# Patient Record
Sex: Female | Born: 1990 | Race: Black or African American | Hispanic: No | Marital: Single | Smoking: Never smoker
Health system: Southern US, Community
[De-identification: ages and names within clinical notes are randomized; demographics above are authoritative.]

## PROBLEM LIST (undated history)

## (undated) DIAGNOSIS — K219 Gastro-esophageal reflux disease without esophagitis: Secondary | ICD-10-CM

## (undated) HISTORY — PX: NO PAST SURGERIES: SHX2092

## (undated) SURGERY — REPAIR, LACERATION, FACE
Anesthesia: General

---

## 2009-03-16 ENCOUNTER — Emergency Department (HOSPITAL_COMMUNITY): Admission: EM | Admit: 2009-03-16 | Discharge: 2009-03-16 | Payer: Self-pay | Admitting: Emergency Medicine

## 2011-04-21 ENCOUNTER — Emergency Department (HOSPITAL_COMMUNITY): Payer: Self-pay

## 2011-04-21 ENCOUNTER — Encounter (HOSPITAL_COMMUNITY): Payer: Self-pay | Admitting: *Deleted

## 2011-04-21 ENCOUNTER — Emergency Department (HOSPITAL_COMMUNITY)
Admission: EM | Admit: 2011-04-21 | Discharge: 2011-04-21 | Disposition: A | Discharge status: Home / Self Care | Payer: Self-pay | Attending: Emergency Medicine | Admitting: Emergency Medicine

## 2011-04-21 DIAGNOSIS — S0100XA Unspecified open wound of scalp, initial encounter: Secondary | ICD-10-CM | POA: Insufficient documentation

## 2011-04-21 DIAGNOSIS — M795 Residual foreign body in soft tissue: Secondary | ICD-10-CM | POA: Insufficient documentation

## 2011-04-21 DIAGNOSIS — R221 Localized swelling, mass and lump, neck: Secondary | ICD-10-CM | POA: Insufficient documentation

## 2011-04-21 DIAGNOSIS — R220 Localized swelling, mass and lump, head: Secondary | ICD-10-CM | POA: Insufficient documentation

## 2011-04-21 DIAGNOSIS — S0990XA Unspecified injury of head, initial encounter: Secondary | ICD-10-CM | POA: Insufficient documentation

## 2011-04-21 MED ORDER — TETANUS-DIPHTH-ACELL PERTUSSIS 5-2.5-18.5 LF-MCG/0.5 IM SUSP
0.5000 mL | Freq: Once | INTRAMUSCULAR | Status: AC
  Administered 2011-04-21: 0.5 mL via INTRAMUSCULAR
  Filled 2011-04-21: qty 0.5
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
## 2011-04-21 NOTE — ED Notes (Signed)
Reports starting today "has a sensation of liquid pouring down the right side of head and face."

## 2011-04-21 NOTE — ED Notes (Signed)
Pt discharged home. Had no further questions. 

## 2011-04-21 NOTE — ED Notes (Signed)
Pt transported to CT ?
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
## 2011-04-21 NOTE — ED Notes (Signed)
Pt resting quietly at the time. No signs of distress. Family at the bedside. Pt alert and oriented x4. Vital signs stable.

## 2011-07-26 ENCOUNTER — Emergency Department (INDEPENDENT_AMBULATORY_CARE_PROVIDER_SITE_OTHER)
Admission: EM | Admit: 2011-07-26 | Discharge: 2011-07-26 | Disposition: A | Discharge status: Home / Self Care | Payer: Self-pay | Source: Home / Self Care | Attending: Emergency Medicine | Admitting: Emergency Medicine

## 2011-07-26 ENCOUNTER — Encounter (HOSPITAL_COMMUNITY): Payer: Self-pay

## 2011-07-26 DIAGNOSIS — K3189 Other diseases of stomach and duodenum: Secondary | ICD-10-CM

## 2011-07-26 DIAGNOSIS — R1013 Epigastric pain: Secondary | ICD-10-CM

## 2011-07-26 MED ORDER — OMEPRAZOLE 20 MG PO CPDR: 20.0000 mg | DELAYED_RELEASE_CAPSULE | Freq: Two times a day (BID) | ORAL | Status: DC

## 2011-07-26 MED ORDER — SUCRALFATE 1 GM/10ML PO SUSP: 1.0000 g | Freq: Four times a day (QID) | ORAL | Status: DC

## 2011-07-26 NOTE — Discharge Instructions (Signed)
Indigestion  Indigestion is discomfort in the upper abdomen that is caused by underlying problems such as gastroesophageal reflux disease (GERD), ulcers, or gallbladder problems.   CAUSES   Indigestion can be caused by many things. Possible causes include:   Stomach acid in the esophagus.   Stomach infections, usually caused by the bacteria H. pylori.   Being overweight.   Hiatal hernia. This means part of the stomach pushes up through the diaphragm.   Overeating.   Emotional problems, such as stress, anxiety, or depression.   Poor nutrition.   Consuming too much alcohol, tobacco, or caffeine.   Consuming spicy foods, fats, peppermint, chocolate, tomato products, citrus, or fruit juices.   Medicines such as aspirin and other anti-inflammatory drugs, hormones, steroids, and thyroid medicines.   Gastroparesis. This is a condition in which the stomach does not empty properly.   Stomach cancer.   Pregnancy, due to an increase in hormone levels, a relaxation of muscles in the digestive tract, and pressure on the stomach from the growing fetus.  SYMPTOMS    Uncomfortable feeling of fullness after eating.   Pain or burning sensation in the upper abdomen.   Bloating.   Belching and gas.   Nausea and vomiting.   Acidic taste in the mouth.   Burning sensation in the chest (heartburn).  DIAGNOSIS   Your caregiver will review your medical history and perform a physical exam. Other tests, such as blood tests, stool tests, X-rays, and other imaging scans, may be done to check for more serious problems.  TREATMENT   Liquid antacids and other drugs may be given to block stomach acid secretion. Medicines that increase esophageal muscle tone may also be given to help reduce symptoms. If an infection is found, antibiotic medicine may be given.  HOME CARE INSTRUCTIONS   Avoid foods and drinks that make your symptoms worse, such as:   Caffeine or alcoholic drinks.   Chocolate.   Peppermint or mint  flavorings.   Garlic and onions.   Spicy foods.   Citrus fruits, such as oranges, lemons, or limes.   Tomato-based foods such as sauce, chili, salsa, and pizza.   Fried and fatty foods.   Avoid eating for the 3 hours prior to your bedtime.   Eat small, frequent meals instead of large meals.   Stop smoking if you smoke.   Maintain a healthy weight.   Wear loose-fitting clothing. Do not wear anything tight around your waist that causes pressure on your stomach.   Raise the head of your bed 4 to 8 inches with wood blocks to help you sleep. Extra pillows will not help.   Only take over-the-counter or prescription medicines as directed by your caregiver.   Do not take aspirin, ibuprofen, or other nonsteroidal anti-inflammatory drugs (NSAIDs).  SEEK IMMEDIATE MEDICAL CARE IF:    You are not better after 2 days.   You have chest pressure or pain that radiates up into your neck, arms, back, jaw, or upper abdomen.   You have difficulty swallowing.   You keep vomiting.   You have black or bloody stools.   You have a fever.   You have dizziness, fainting, difficulty breathing, or heavy sweating.   You have severe abdominal pain.   You lose weight without trying.  MAKE SURE YOU:   Understand these instructions.   Will watch your condition.   Will get help right away if you are not doing well or get worse.  Document Released: 04/25/2004 

## 2011-07-26 NOTE — ED Notes (Signed)
C/o pain in epigastric area for past couple of months, worse past 2 days; kept me up all night yest; has not taken any meds for this problem; pain 8 last night, 4-5 now; NAD; pain epigastric to upper sternal area , through to mid/upper back
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
## 2011-08-01 ENCOUNTER — Telehealth (HOSPITAL_COMMUNITY): Payer: Self-pay | Admitting: *Deleted

## 2011-08-01 NOTE — ED Notes (Signed)
Pt. came to [facility] for her HIV result. Pt. verified with picture ID and shown her neg. result. Pt.instructed to get it rechecked in 6 mos. if she had an exposure. [name] 08/01/2011

## 2013-03-30 ENCOUNTER — Inpatient Hospital Stay (HOSPITAL_COMMUNITY)
Admission: AD | Admit: 2013-03-30 | Discharge: 2013-03-30 | Disposition: A | Discharge status: Home / Self Care | Payer: Self-pay | Source: Ambulatory Visit | Attending: Emergency Medicine | Admitting: Emergency Medicine

## 2013-03-30 ENCOUNTER — Inpatient Hospital Stay (HOSPITAL_COMMUNITY): Payer: Self-pay

## 2013-03-30 ENCOUNTER — Encounter (HOSPITAL_COMMUNITY): Payer: Self-pay

## 2013-03-30 DIAGNOSIS — R209 Unspecified disturbances of skin sensation: Secondary | ICD-10-CM | POA: Insufficient documentation

## 2013-03-30 DIAGNOSIS — R0602 Shortness of breath: Secondary | ICD-10-CM | POA: Insufficient documentation

## 2013-03-30 DIAGNOSIS — M79609 Pain in unspecified limb: Secondary | ICD-10-CM | POA: Insufficient documentation

## 2013-03-30 DIAGNOSIS — K219 Gastro-esophageal reflux disease without esophagitis: Secondary | ICD-10-CM | POA: Insufficient documentation

## 2013-03-30 DIAGNOSIS — R000 Tachycardia, unspecified: Secondary | ICD-10-CM

## 2013-03-30 DIAGNOSIS — R079 Chest pain, unspecified: Secondary | ICD-10-CM | POA: Insufficient documentation

## 2013-03-30 HISTORY — DX: Gastro-esophageal reflux disease without esophagitis: K21.9

## 2013-03-30 LAB — CBC
HCT: 36.1 % (ref 36.0–46.0)
Hemoglobin: 12.3 g/dL (ref 12.0–15.0)
MCH: 31.1 pg (ref 26.0–34.0)
MCV: 91.4 fL (ref 78.0–100.0)
Platelets: 238 10*3/uL (ref 150–400)
RBC: 3.95 MIL/uL (ref 3.87–5.11)
WBC: 5.1 10*3/uL (ref 4.0–10.5)

## 2013-03-30 LAB — COMPREHENSIVE METABOLIC PANEL
AST: 19 U/L (ref 0–37)
BUN: 11 mg/dL (ref 6–23)
CO2: 25 mEq/L (ref 19–32)
Chloride: 102 mEq/L (ref 96–112)
Creatinine, Ser: 0.64 mg/dL (ref 0.50–1.10)
GFR calc Af Amer: >90 mL/min (ref >90)
GFR calc non Af Amer: >90 mL/min (ref >90)
Glucose, Bld: 78 mg/dL (ref 70–99)
Total Bilirubin: 0.3 mg/dL (ref 0.3–1.2)

## 2013-03-30 LAB — URINALYSIS, ROUTINE W REFLEX MICROSCOPIC
Hgb urine dipstick: NEGATIVE
Nitrite: NEGATIVE
Protein, ur: NEGATIVE mg/dL
Urobilinogen, UA: 1 mg/dL (ref 0.0–1.0)

## 2013-03-30 LAB — T4, FREE: Free T4: 0.92 ng/dL (ref 0.80–1.80)

## 2013-03-30 LAB — T3, FREE: T3, Free: 2.9 pg/mL (ref 2.3–4.2)

## 2013-03-30 LAB — PREGNANCY, URINE: Preg Test, Ur: NEGATIVE

## 2013-03-30 LAB — TSH: TSH: 0.972 u[IU]/mL (ref 0.350–4.500)

## 2013-03-30 MED ORDER — LORAZEPAM 2 MG/ML IJ SOLN
0.5000 mg | Freq: Once | INTRAMUSCULAR | Status: AC
  Administered 2013-03-30: 0.5 mg via INTRAVENOUS
  Filled 2013-03-30: qty 1

## 2013-03-30 MED ORDER — ASPIRIN 81 MG PO CHEW
324.0000 mg | CHEWABLE_TABLET | Freq: Once | ORAL | Status: AC
  Administered 2013-03-30: 324 mg via ORAL

## 2013-03-30 MED ORDER — ASPIRIN 81 MG PO CHEW
CHEWABLE_TABLET | ORAL | Status: AC
  Filled 2013-03-30: qty 4

## 2013-03-30 MED ORDER — IOHEXOL 350 MG/ML SOLN
100.0000 mL | Freq: Once | INTRAVENOUS | Status: AC | PRN
  Administered 2013-03-30: 100 mL via INTRAVENOUS

## 2013-03-30 MED ORDER — SODIUM CHLORIDE 0.9 % IV BOLUS (SEPSIS)
1000.0000 mL | Freq: Once | INTRAVENOUS | Status: AC
  Administered 2013-03-30: 1000 mL via INTRAVENOUS

## 2013-03-30 NOTE — MAU Note (Signed)
Patient is in with c/o chest pain that have gotten worse (started 6 months ago) and left arm numbness. She c/o tensed in her upper abdominal wall, back. She states that she have periods of her body feeling stiff/tensed. She is speaking without distress, she is alert, oriented able to move all extremities. Her lungs are clear, her pupils are equal.

## 2013-03-30 NOTE — MAU Provider Note (Signed)
History     CSN: [id number]  Arrival date and time: 03/30/13 1031   First Provider Initiated Contact with Patient 03/30/13 1047      Chief Complaint  Patient presents with  . Chest Pain  . Nausea  . Numbness   HPI This is a 22 y.o. female who is not pregnant who presents with c/o chest pain, left arm pain and total body numbness. States has had this pain for 6 months, usually at night when supine. TOday the pain has been present all day, even when up and around. Also complains of new onset left arm pain and shortness of breath. States passed out a month ago, but did not strike head.  Has had numbness ever since. Was seen at Urgent Care for this and evaluated for GERD.  States never got better. Does not have a primary doctor, [place]   RN Note: Patient is in with c/o chest pain that have gotten worse (started 6 months ago) and left arm numbness. She c/o tensed in her upper abdominal wall, back. She states that she have periods of her body feeling stiff/tensed. She is speaking without distress, she is alert, oriented able to move all extremities. Her lungs are clear, her pupils are equal.      OB History   Grav Para Term Preterm Abortions TAB SAB Ect Mult Living   0               Past Medical History  Diagnosis Date  . Acid reflux     Past Surgical History  Procedure Laterality Date  . No past surgeries      History reviewed. No pertinent family history.  History  Substance Use Topics  . Smoking status: Never Smoker   . Smokeless tobacco: Not on file  . Alcohol Use: Yes     Comment: occasional on weekends    Allergies: No Known Allergies  Prescriptions prior to admission  Medication Sig Dispense Refill  . omeprazole (PRILOSEC) 20 MG capsule Take 1 capsule (20 mg total) by mouth 2 (two) times daily before a meal.  60 capsule  0  . sucralfate (CARAFATE) 1 GM/10ML suspension Take 10 mLs (1 g total) by mouth 4 (four) times daily.  420 mL  0     Review of Systems  Constitutional: Negative for fever, chills and malaise/fatigue.  HENT: Negative for sore throat.   Eyes: Positive for blurred vision.  Respiratory: Positive for shortness of breath. Negative for cough and wheezing.   Cardiovascular: Positive for chest pain. Negative for leg swelling.  Gastrointestinal: Negative for heartburn, nausea, vomiting, abdominal pain, diarrhea and constipation.  Genitourinary: Negative for dysuria.  Musculoskeletal: Negative for back pain, myalgias and neck pain.  Neurological: Positive for tingling, tremors and weakness. Negative for dizziness, focal weakness, seizures, loss of consciousness and headaches.  Psychiatric/Behavioral: The patient is nervous/anxious.    Physical Exam   Blood pressure 119/76, pulse 94, temperature 98.3 F (36.8 C), temperature source Oral, resp. rate 18, last menstrual period 03/25/2013, SpO2 100.00%.  Physical Exam  Constitutional: She is oriented to person, place, and time. She appears well-developed and well-nourished. No distress.  HENT:  Head: Normocephalic.  Eyes: Pupils are equal, round, and reactive to light.  Neck: Normal range of motion. Neck supple.  Cardiovascular: Normal rate, regular rhythm and normal heart sounds.  Exam reveals no gallop and no friction rub.   No murmur heard. Respiratory: Effort normal and breath sounds normal. No stridor.  No respiratory distress (O2 Saturation 100% on room air). She has no wheezes. She has no rales. She exhibits no tenderness.  GI: Soft. She exhibits no distension. There is no tenderness. There is no rebound and no guarding.  Musculoskeletal: Normal range of motion.  Lymphadenopathy:    She has no cervical adenopathy.  Neurological: She is alert and oriented to person, place, and time.  Skin: Skin is warm and dry.  Psychiatric: She has a normal mood and affect.    MAU Course  Procedures  MDM Chest pain protocol initiated.  ASA given. Labs drawn,  except Troponin.  EKG done >> Normal sinus rhythm   Assessment and Plan  A:  Chest pain       Shortness of breath with normal oxygen saturation       Paresthesia  P:  ED physician from [facility] ED contacted, Dr Gwendolyn Grant accepts patient       No gynecologic condition / complaint       Transfer to [facility] ED for further evaluation  [facility] 03/30/2013, 11:09 AM

## 2013-03-30 NOTE — ED Notes (Signed)
[facility]. Pt has hx of GERD, has not been taking her regular medications because "I don't feel like I need them". Pt also states sharp intermittant CP x6 months that gets worse at night and c/o left sided numbness with the cp. Pt ate pizza last night and states the pain has been worse since then, given 324mg  of aspirin in route. 20 G in left wrist.

## 2013-03-30 NOTE — ED Notes (Signed)
Pt called out feeling anxious, hyperventilating. States she doesn't feel good. HR elevated. Dr. Juleen China made aware. Pt instructed on deep breathing techniques. Reassurance provided.

## 2013-03-31 LAB — DRUGS OF ABUSE SCREEN W/O ALC, ROUTINE URINE
Benzodiazepines.: NEGATIVE
Marijuana Metabolite: NEGATIVE
Opiate Screen, Urine: NEGATIVE
Phencyclidine (PCP): NEGATIVE
Propoxyphene: NEGATIVE

## 2013-04-02 NOTE — MAU Provider Note (Signed)
Attestation of Attending Supervision of Advanced Practitioner (CNM/NP): Evaluation and management procedures were performed by the Advanced Practitioner under my supervision and collaboration.  I have reviewed the Advanced Practitioner's note and chart, and I agree with the management and plan.  Reannah Totten 04/02/2013 12:33 PM   

## 2013-04-07 NOTE — ED Provider Notes (Signed)
CSN: [id number]     Arrival date & time 03/30/13  1031 History   First MD Initiated Contact with Patient 03/30/13 1238     Chief Complaint  Patient presents with  . Chest Pain  . Nausea  . Numbness   (Consider location/radiation/quality/duration/timing/severity/associated sxs/prior Treatment) HPI  22yF with CP and palpitations. Seen at [facility] prior to arrival and referred to ED. CP which is intermittent and notices mostly at night but occasional during day. HAs been going on for months. No appreciable exacerbating or relieving factors otherwise. No cough. No sob. No fever or chills. No night sweats. No weight loss. No unusual leg pain or swelling. Associated sometimes with palpitations. Feels like heart racing. No n/v. No diaphoresis. Hx of gerd but not compliant with meds.   Past Medical History  Diagnosis Date  . Acid reflux    Past Surgical History  Procedure Laterality Date  . No past surgeries     History reviewed. No pertinent family history. History  Substance Use Topics  . Smoking status: Never Smoker   . Smokeless tobacco: Not on file  . Alcohol Use: Yes     Comment: occasional on weekends   OB History   Grav Para Term Preterm Abortions TAB SAB Ect Mult Living   0              Review of Systems  All systems reviewed and negative, other than as noted in HPI.   Allergies  Review of patient's allergies indicates no known allergies.  Home Medications  No current outpatient prescriptions on file. BP 122/80  Pulse 81  Temp(Src) 98.6 F (37 C) (Oral)  Resp 20  Ht 5\' 4"  (1.626 m)  Wt 108 lb (48.988 kg)  BMI 18.53 kg/m2  SpO2 100%  LMP 03/25/2013 Physical Exam  Nursing note and vitals reviewed. Constitutional: She appears well-developed and well-nourished. No distress.  HENT:  Head: Normocephalic and atraumatic.  Eyes: Conjunctivae are normal. Right eye exhibits no discharge. Left eye exhibits no discharge.  Neck: Neck supple.  Cardiovascular: Normal rate,  regular rhythm and normal heart sounds.  Exam reveals no gallop and no friction rub.   No murmur heard. Pulmonary/Chest: Effort normal and breath sounds normal. No respiratory distress. She exhibits no tenderness.  Abdominal: Soft. She exhibits no distension. There is no tenderness.  Musculoskeletal: She exhibits no edema and no tenderness.  Lower extremities symmetric as compared to each other. No calf tenderness. Negative Homan's. No palpable cords.   Neurological: She is alert.  Skin: Skin is warm and dry.  Psychiatric: She has a normal mood and affect. Her behavior is normal. Thought content normal.    ED Course  Procedures (including critical care time) Labs Review Labs Reviewed  PRO B NATRIURETIC PEPTIDE  CBC  COMPREHENSIVE METABOLIC PANEL  URINALYSIS, ROUTINE W REFLEX MICROSCOPIC  DRUGS OF ABUSE SCREEN W/O ALC, ROUTINE URINE  TSH  T4, FREE  T3, FREE  PREGNANCY, URINE   Imaging Review No results found.  Ct Angio Chest W/cm &/or Wo Cm  03/30/2013   CLINICAL DATA:  Progressive chest pain for 6 months with left arm numbness. Question pulmonary embolism.  EXAM: CT ANGIOGRAPHY CHEST WITH CONTRAST  TECHNIQUE: Multidetector CT imaging of the chest was performed using the standard protocol during bolus administration of intravenous contrast. Multiplanar CT image reconstructions including MIPs were obtained to evaluate the vascular anatomy.  CONTRAST:  OMNIPAQUE IOHEXOL 350 MG/ML SOLN  COMPARISON:  None.  FINDINGS: The pulmonary arteries  are well opacified with contrast. There is no evidence of acute pulmonary embolism. The thoracic aorta and great vessels appear normal.  There is no pleural or pericardial effusion. There are no enlarged mediastinal or hilar lymph nodes. A small amount of residual thymic tissue is present within the pre-vascular space.  The lungs are clear.  The visualized upper abdomen appears normal.  Review of the MIP images confirms the above findings.   IMPRESSION: Normal examination. No evidence of pulmonary embolism or other acute chest process.   Electronically Signed   By: Roxy HorsemanBill  Veazey M.D.   On: 03/30/2013 16:23   EKG Interpretation    Date/Time:  Tuesday March 30 2013 12:03:33 EST Ventricular Rate:  89 PR Interval:  141 QRS Duration: 89 QT Interval:  378 QTC Calculation: 460 R Axis:   27 Text Interpretation:  Sinus rhythm RSR' in V1 or V2, probably normal variant Baseline wander in lead(s) V6 No significant change was found as compared to EKG earlier today. Confirmed by Juleen ChinaKOHUT  MD, Atalie Oros [phone]) on 03/30/2013 1:01:25 PM            MDM   1. Chest pain at rest   2. Chest pain   3. Sinus tachycardia    22yF with CP. Atypical for ACS. Little in terms of risk factors. EKG unchanged. No evidence of PE, dissection or pneumothorax. Doubt infectious. Very low suspicion for emergent etiology. Sinus tach resolved with IVF and small dose ativan. No HTN or symptoms typical of being thyroid toxic, but studies sent to aid follow-up providers. Needs to establish PCP. Return precautions discussed.     Raeford RazorStephen Ailyn Gladd, MD 04/07/13 [phone]

## 2016-09-09 ENCOUNTER — Emergency Department (HOSPITAL_COMMUNITY)
Admission: EM | Admit: 2016-09-09 | Discharge: 2016-09-10 | Disposition: A | Discharge status: Home / Self Care | Payer: Self-pay | Attending: Emergency Medicine | Admitting: Emergency Medicine

## 2016-09-09 ENCOUNTER — Encounter (HOSPITAL_COMMUNITY): Payer: Self-pay | Admitting: Emergency Medicine

## 2016-09-09 DIAGNOSIS — K602 Anal fissure, unspecified: Secondary | ICD-10-CM | POA: Insufficient documentation

## 2016-09-09 DIAGNOSIS — K625 Hemorrhage of anus and rectum: Secondary | ICD-10-CM | POA: Insufficient documentation

## 2016-09-09 LAB — CBC
HEMATOCRIT: 38.5 % (ref 36.0–46.0)
Hemoglobin: 13.2 g/dL (ref 12.0–15.0)
MCH: 31.1 pg (ref 26.0–34.0)
MCHC: 34.3 g/dL (ref 30.0–36.0)
MCV: 90.8 fL (ref 78.0–100.0)
PLATELETS: 206 10*3/uL (ref 150–400)
RBC: 4.24 MIL/uL (ref 3.87–5.11)
RDW: 11.6 % (ref 11.5–15.5)
WBC: 6.5 10*3/uL (ref 4.0–10.5)

## 2016-09-09 LAB — COMPREHENSIVE METABOLIC PANEL
ALBUMIN: 4.6 g/dL (ref 3.5–5.0)
ALT: 13 U/L — AB (ref 14–54)
AST: 23 U/L (ref 15–41)
Alkaline Phosphatase: 43 U/L (ref 38–126)
Anion gap: 8 (ref 5–15)
BILIRUBIN TOTAL: 0.9 mg/dL (ref 0.3–1.2)
BUN: 11 mg/dL (ref 6–20)
CO2: 24 mmol/L (ref 22–32)
CREATININE: 0.69 mg/dL (ref 0.44–1.00)
Calcium: 9.3 mg/dL (ref 8.9–10.3)
Chloride: 105 mmol/L (ref 101–111)
GFR calc Af Amer: >60 mL/min (ref >60)
GLUCOSE: 77 mg/dL (ref 65–99)
POTASSIUM: 3.8 mmol/L (ref 3.5–5.1)
Sodium: 137 mmol/L (ref 135–145)
TOTAL PROTEIN: 7.8 g/dL (ref 6.5–8.1)

## 2016-09-09 LAB — TYPE AND SCREEN
ABO/RH(D): B POS
ANTIBODY SCREEN: NEGATIVE

## 2016-09-09 NOTE — ED Triage Notes (Signed)
Pt reports having bright red blood when having bowel movement. Pt denies any abd pain at this time. Pt only reports bleeding when having bowel movement.

## 2016-09-09 NOTE — ED Notes (Signed)
Report given to Atilade, RN 

## 2016-09-10 LAB — ABO/RH: ABO/RH(D): B POS

## 2016-09-10 LAB — POC OCCULT BLOOD, ED: FECAL OCCULT BLD: POSITIVE — AB

## 2016-09-10 NOTE — ED Provider Notes (Signed)
[facility]-EMERGENCY DEPT Provider Note   CSN: [id number] Arrival date & time: 09/09/16  1928     History   Chief Complaint Chief Complaint  Patient presents with  . Blood In Stools    HPI Taylor Mckee is a 26 y.o. female.  HPI 26 year old African-American female with no significant past medical history presents to the emergency Department today with complaints of bright red blood when having a bowel movement this evening. Patient states his happened in the past. States that when she went to wipe with the tissue she has blood on the tissue and a small amount of blood in the toilet but no gross melena or hematochezia in her stool. States that she does strain have a bowel movement. She got progressively constipated but does have hard stools. Does not take a lot of fiber in. She denies any abdominal pain, fever, chills, urinary symptoms, vaginal bleeding, vaginal discharge. The patient denies any pain at this time. She only had one episode of this happened to her today and has not had any further episodes while in the ED. Past Medical History:  Diagnosis Date  . Acid reflux     There are no active problems to display for this patient.   Past Surgical History:  Procedure Laterality Date  . NO PAST SURGERIES      OB History    Gravida Para Term Preterm AB Living   0             SAB TAB Ectopic Multiple Live Births                   Home Medications    Prior to Admission medications   Not on File    Family History History reviewed. No pertinent family history.  Social History Social History  Substance Use Topics  . Smoking status: Never Smoker  . Smokeless tobacco: Never Used  . Alcohol use Yes     Comment: occasional on weekends     Allergies   Patient has no known allergies.   Review of Systems Review of Systems  Constitutional: Negative for chills and fever.  Gastrointestinal: Positive for anal bleeding and blood in stool. Negative for abdominal  pain, constipation, diarrhea, nausea, rectal pain and vomiting.  Genitourinary: Negative for dysuria, flank pain, frequency, hematuria and urgency.  Skin: Negative for pallor.  Neurological: Negative for dizziness, weakness and light-headedness.     Physical Exam Updated Vital Signs BP (!) 134/98 (BP Location: Right Arm)   Pulse 95   Temp 99.2 F (37.3 C) (Oral)   Resp 18   Ht 5\' 4"  (1.626 m)   Wt 46 kg (101 lb 8 oz)   LMP 08/21/2016   SpO2 100%   BMI 17.42 kg/m   Physical Exam  Constitutional: She appears well-developed and well-nourished. No distress.  HENT:  Head: Normocephalic and atraumatic.  Mouth/Throat: Oropharynx is clear and moist.  Eyes: Conjunctivae are normal. Right eye exhibits no discharge. Left eye exhibits no discharge. No scleral icterus.  Neck: Normal range of motion. Neck supple. No thyromegaly present.  Cardiovascular: Normal rate, regular rhythm, normal heart sounds and intact distal pulses.   Pulmonary/Chest: Effort normal and breath sounds normal.  Abdominal: Soft. Bowel sounds are normal. She exhibits no distension. There is no tenderness. There is no rebound and no guarding.  Genitourinary:  Genitourinary Comments: Chaperone present for exam. Anal fissure noted. No external hemorrhoids noted. No internal hemorrhoids noted. No pain with rectal exam. No gross  melena or hematochezia noted. Small amount of soft brown stool noted in the rectal vault. Hemoccult was positive.  Musculoskeletal: Normal range of motion.  Lymphadenopathy:    She has no cervical adenopathy.  Neurological: She is alert.  Skin: Skin is warm and dry.  Nursing note and vitals reviewed.    ED Treatments / Results  Labs (all labs ordered are listed, but only abnormal results are displayed) Labs Reviewed  COMPREHENSIVE METABOLIC PANEL - Abnormal; Notable for the following:       Result Value   ALT 13 (*)    All other components within normal limits  POC OCCULT BLOOD, ED -  Abnormal; Notable for the following:    Fecal Occult Bld POSITIVE (*)    All other components within normal limits  CBC  TYPE AND SCREEN  ABO/RH    EKG  EKG Interpretation None       Radiology No results found.  Procedures Procedures (including critical care time)  Medications Ordered in ED Medications - No data to display   Initial Impression / Assessment and Plan / ED Course  I have reviewed the triage vital signs and the nursing notes.  Pertinent labs & imaging results that were available during my care of the patient were reviewed by me and considered in my medical decision making (see chart for details).     Patient presents to the emergency Department today with complaints of red blood noted in her toilet paper when she wiped after a bowel movement today. No small amount of blood in the toilet but no gross melena or hematochezia. Patient denies any rectal pain or abdominal pain. No fevers. Vital signs are stable in the ED. Rectal exam reveals small anal fissure but no hemorrhoids noted. She has no pain with palpation of the rectum. No gross melena or hematochezia noted. Hemoccult was positive. Hemoglobin is stable. Patient has had no further episodes of this happening while in the ED. States she does have a history of this before. States she has not taken off fiber and. Endorses hard stools and straining at times with bowel movements but no significant constipation. The patient's symptoms are due to low fiber diet and constipation. There is no erythema or pain with palpation of the rectum concerning for perirectal abscess. She has no abdominal pain that'll be concerning for diverticulitis at this time. She is afebrile. Have given patient strict return precautions. Encouraged to follow up with GI doctor for symptoms are persisting. Encouraged her to increase her fiber diet. She is hemodynamically stable in no acute distress at this time. Has no further questions prior to  discharge.   Final Clinical Impressions(s) / ED Diagnoses   Final diagnoses:  Rectal bleeding  Anal fissure    New Prescriptions New Prescriptions   No medications on file     Wallace Keller 09/10/16 0030    Molpus, Jonny Ruiz, MD 09/10/16 [phone]

## 2016-09-10 NOTE — Discharge Instructions (Signed)
This is an anal fissure. Make sure you're taking plenty of fiber and. He may use over-the-counter Metamucil and stool softeners. If you develop any fevers, abdominal pain, worsening bleeding please return to the ED. Follow up with GI.

## 2017-02-10 ENCOUNTER — Encounter (HOSPITAL_BASED_OUTPATIENT_CLINIC_OR_DEPARTMENT_OTHER): Payer: Self-pay

## 2017-02-10 ENCOUNTER — Other Ambulatory Visit: Payer: Self-pay

## 2017-02-10 ENCOUNTER — Emergency Department (HOSPITAL_BASED_OUTPATIENT_CLINIC_OR_DEPARTMENT_OTHER)
Admission: EM | Admit: 2017-02-10 | Discharge: 2017-02-10 | Disposition: A | Discharge status: Home / Self Care | Payer: Self-pay | Attending: Emergency Medicine | Admitting: Emergency Medicine

## 2017-02-10 ENCOUNTER — Emergency Department (HOSPITAL_BASED_OUTPATIENT_CLINIC_OR_DEPARTMENT_OTHER): Payer: Self-pay

## 2017-02-10 DIAGNOSIS — Y939 Activity, unspecified: Secondary | ICD-10-CM | POA: Insufficient documentation

## 2017-02-10 DIAGNOSIS — S92422A Displaced fracture of distal phalanx of left great toe, initial encounter for closed fracture: Secondary | ICD-10-CM | POA: Insufficient documentation

## 2017-02-10 DIAGNOSIS — X58XXXA Exposure to other specified factors, initial encounter: Secondary | ICD-10-CM | POA: Insufficient documentation

## 2017-02-10 DIAGNOSIS — Y999 Unspecified external cause status: Secondary | ICD-10-CM | POA: Insufficient documentation

## 2017-02-10 DIAGNOSIS — Y929 Unspecified place or not applicable: Secondary | ICD-10-CM | POA: Insufficient documentation

## 2017-02-10 MED ORDER — IBUPROFEN 800 MG PO TABS
800.0000 mg | ORAL_TABLET | Freq: Once | ORAL | Status: AC
  Administered 2017-02-10: 800 mg via ORAL
  Filled 2017-02-10: qty 1

## 2017-02-10 MED ORDER — BUPIVACAINE-EPINEPHRINE (PF) 0.5% -1:200000 IJ SOLN: 1.8000 mL | Freq: Once | INTRAMUSCULAR | Status: DC

## 2017-02-10 MED ORDER — CEPHALEXIN 500 MG PO CAPS: 500.0000 mg | ORAL_CAPSULE | Freq: Four times a day (QID) | ORAL | 0 refills | Status: DC

## 2017-02-10 MED FILL — CEPHALEXIN 500 MG CAPSULE: 500 | 5 days supply | Qty: 20 | Fill #0

## 2017-02-10 NOTE — ED Triage Notes (Signed)
Pt states she fell 11/9-pain to left great toe and right hand-NAD-steady gait

## 2017-02-10 NOTE — Discharge Instructions (Signed)
You have a fracture of your left great toe.  It looks like it could potentially be infected, I prescribed you an antibiotic for this.  Please take antibiotic 4 times a day for the next 5 days.  Take ibuprofen 800 mg every 6 hours as needed for pain.  Please contact Dr. Charlann Boxerlin (the bone doctor) for follow-up appointment as soon as possible.  Please use the boot and buddy tape for stability.  Continue to ice the toe at least twice a day.  Return to the emergency department if you notice that the redness and swelling is spreading despite taking antibiotics.  Please also return if you develop a fever greater than 100.4 F.  Return for any other worsening symptoms as well.
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
## 2017-02-10 NOTE — ED Notes (Signed)
ED Provider at bedside. 

## 2017-04-23 ENCOUNTER — Encounter (HOSPITAL_COMMUNITY): Payer: Self-pay | Admitting: Emergency Medicine

## 2017-04-23 ENCOUNTER — Emergency Department (HOSPITAL_COMMUNITY): Payer: Self-pay

## 2017-04-23 ENCOUNTER — Emergency Department (HOSPITAL_COMMUNITY)
Admission: EM | Admit: 2017-04-23 | Discharge: 2017-04-23 | Disposition: A | Discharge status: Home / Self Care | Payer: Self-pay | Attending: Emergency Medicine | Admitting: Emergency Medicine

## 2017-04-23 DIAGNOSIS — W503XXA Accidental bite by another person, initial encounter: Secondary | ICD-10-CM

## 2017-04-23 DIAGNOSIS — S0121XA Laceration without foreign body of nose, initial encounter: Secondary | ICD-10-CM | POA: Insufficient documentation

## 2017-04-23 DIAGNOSIS — S0081XA Abrasion of other part of head, initial encounter: Secondary | ICD-10-CM | POA: Insufficient documentation

## 2017-04-23 DIAGNOSIS — Y939 Activity, unspecified: Secondary | ICD-10-CM | POA: Insufficient documentation

## 2017-04-23 DIAGNOSIS — Z23 Encounter for immunization: Secondary | ICD-10-CM | POA: Insufficient documentation

## 2017-04-23 DIAGNOSIS — Y929 Unspecified place or not applicable: Secondary | ICD-10-CM | POA: Insufficient documentation

## 2017-04-23 DIAGNOSIS — Y999 Unspecified external cause status: Secondary | ICD-10-CM | POA: Insufficient documentation

## 2017-04-23 LAB — I-STAT BETA HCG BLOOD, ED (MC, WL, AP ONLY)

## 2017-04-23 LAB — RAPID HIV SCREEN (HIV 1/2 AB+AG)
HIV 1/2 Antibodies: NONREACTIVE
HIV-1 P24 ANTIGEN - HIV24: NONREACTIVE

## 2017-04-23 MED ORDER — HYDROCODONE-ACETAMINOPHEN 5-325 MG PO TABS: 1.0000 | ORAL_TABLET | Freq: Four times a day (QID) | ORAL | 0 refills | Status: DC | PRN

## 2017-04-23 MED ORDER — AMOXICILLIN-POT CLAVULANATE 875-125 MG PO TABS: 1.0000 | ORAL_TABLET | Freq: Two times a day (BID) | ORAL | 0 refills | Status: DC

## 2017-04-23 MED ORDER — AMOXICILLIN-POT CLAVULANATE 875-125 MG PO TABS
1.0000 | ORAL_TABLET | Freq: Once | ORAL | Status: AC
  Administered 2017-04-23: 1 via ORAL
  Filled 2017-04-23: qty 1

## 2017-04-23 MED ORDER — TETANUS-DIPHTH-ACELL PERTUSSIS 5-2.5-18.5 LF-MCG/0.5 IM SUSP
0.5000 mL | Freq: Once | INTRAMUSCULAR | Status: AC
  Administered 2017-04-23: 0.5 mL via INTRAMUSCULAR
  Filled 2017-04-23: qty 0.5

## 2017-04-23 MED ORDER — HYDROCODONE-ACETAMINOPHEN 5-325 MG PO TABS
1.0000 | ORAL_TABLET | Freq: Once | ORAL | Status: AC
  Administered 2017-04-23: 1 via ORAL
  Filled 2017-04-23: qty 1
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
## 2017-04-23 NOTE — Discharge Instructions (Signed)
You will need to follow-up in Dr. Damien FusiMarcellino's clinic today.  She will call to arrange time.  If you do not hear from her, present to the clinic at 1 PM today.  Take antibiotics as prescribed.  If you should notice signs of redness, purulent drainage, warmth, fever you need to be seen in the emergency department.

## 2017-04-23 NOTE — ED Notes (Signed)
Irrigated patients nose laceration with approximately of NS. Patient was getting agitated and did not want more.
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
## 2018-09-01 ENCOUNTER — Emergency Department (HOSPITAL_BASED_OUTPATIENT_CLINIC_OR_DEPARTMENT_OTHER): Payer: Self-pay

## 2018-09-01 ENCOUNTER — Emergency Department (HOSPITAL_BASED_OUTPATIENT_CLINIC_OR_DEPARTMENT_OTHER)
Admission: EM | Admit: 2018-09-01 | Discharge: 2018-09-01 | Disposition: A | Discharge status: Home / Self Care | Payer: Self-pay | Attending: Emergency Medicine | Admitting: Emergency Medicine

## 2018-09-01 ENCOUNTER — Other Ambulatory Visit: Payer: Self-pay

## 2018-09-01 ENCOUNTER — Encounter (HOSPITAL_BASED_OUTPATIENT_CLINIC_OR_DEPARTMENT_OTHER): Payer: Self-pay | Admitting: *Deleted

## 2018-09-01 DIAGNOSIS — Y9389 Activity, other specified: Secondary | ICD-10-CM | POA: Insufficient documentation

## 2018-09-01 DIAGNOSIS — Y92008 Other place in unspecified non-institutional (private) residence as the place of occurrence of the external cause: Secondary | ICD-10-CM | POA: Insufficient documentation

## 2018-09-01 DIAGNOSIS — Y999 Unspecified external cause status: Secondary | ICD-10-CM | POA: Insufficient documentation

## 2018-09-01 DIAGNOSIS — W108XXA Fall (on) (from) other stairs and steps, initial encounter: Secondary | ICD-10-CM | POA: Insufficient documentation

## 2018-09-01 DIAGNOSIS — S8001XA Contusion of right knee, initial encounter: Secondary | ICD-10-CM | POA: Insufficient documentation

## 2018-09-01 NOTE — Discharge Instructions (Signed)
You were seen in the ED for right knee pain.  X-ray is negative for any bony injuries, dislocations.  On exam he had swelling and pain along the quadriceps tendon.  This is probably from a contusion that is causing pain, swelling.  We will treat this with compression, rest, alternating ibuprofen/acetaminophen for the next 7 to 10 days.  Wear the Ace wrap or a knee sleeve to help provide stability and compression, this will help with pain, sudden movements and swelling.  Follow-up with primary care doctor or orthopedist in 7 to 10 days if the pain and range of motion have not improved  Return to the ED for worsening symptoms, redness, warmth, fevers, chills, calf pain or swelling, loss of sensation or strength to the extremity

## 2018-09-01 NOTE — ED Notes (Signed)
Pt. Reports a full feeling in the back side of the R knee and in the top of the knee cap area.

## 2018-09-01 NOTE — ED Provider Notes (Signed)
[facility] EMERGENCY DEPARTMENT Provider Note   CSN: [id number] Arrival date & time: 09/01/18  1726    History   Chief Complaint Chief Complaint  Patient presents with  . Knee Injury    HPI Taylor Mckee is a 28 y.o. female presents to the ED for evaluation of right knee pain.  Sudden onset 5 days ago when she had a mechanical fall.  States that she tripped and fell, sliding down about 13 steps in her home.  Thinks that she bumped the top of her knee on the wall as she was sliding down.  Pain is mild, worse with weightbearing and full extension.  Pain is localized to the area above her kneecap.  Associated with mild edema, bruising and abrasion.  Feels like she cannot fully extend her knee.  She denies any other injuries after the fall.  Denies distal paresthesias, numbness, calf pain or swelling.  No anticoagulants.  No previous history of injury or surgery to this joint.     HPI  Past Medical History:  Diagnosis Date  . Acid reflux     There are no active problems to display for this patient.   Past Surgical History:  Procedure Laterality Date  . NO PAST SURGERIES       OB History    Gravida  0   Para      Term      Preterm      AB      Living        SAB      TAB      Ectopic      Multiple      Live Births               Home Medications    Prior to Admission medications   Medication Sig Start Date End Date Taking? Authorizing Provider  amoxicillin-clavulanate (AUGMENTIN) 875-125 MG tablet Take 1 tablet by mouth 2 (two) times daily. One po bid x 7 days 04/23/17   Loren RacerYelverton, David, MD  HYDROcodone-acetaminophen [facility](NORCO) 5-325 MG tablet Take 1 tablet by mouth every 6 (six) hours as needed for severe pain. 04/23/17   Loren RacerYelverton, David, MD    Family History No family history on file.  Social History Social History   Tobacco Use  . Smoking status: Never Smoker  . Smokeless tobacco: Never Used  Substance Use Topics  . Alcohol  use: Yes    Comment: occ  . Drug use: No     Allergies   Patient has no known allergies.   Review of Systems Review of Systems  Musculoskeletal: Positive for arthralgias, gait problem and joint swelling.  All other systems reviewed and are negative.    Physical Exam Updated Vital Signs BP 125/73 (BP Location: Right Arm)   Pulse 85   Temp 98.2 F (36.8 C) (Oral)   Resp 14   Ht 5\' 4"  (1.626 m)   Wt 52.9 kg   LMP 08/25/2018   SpO2 100%   BMI 20.01 kg/m   Physical Exam Constitutional:      Appearance: She is well-developed. She is not toxic-appearing.  HENT:     Head: Normocephalic.     Right Ear: External ear normal.     Left Ear: External ear normal.     Nose: Nose normal.  Eyes:     Conjunctiva/sclera: Conjunctivae normal.  Neck:     Musculoskeletal: Full passive range of motion without pain.     Comments:  Right lower extremity is well perfused.  No calf edema or tenderness. Cardiovascular:     Rate and Rhythm: Normal rate.  Pulmonary:     Effort: Pulmonary effort is normal. No tachypnea or respiratory distress.  Musculoskeletal: Normal range of motion.        General: Swelling and tenderness present.     Comments: Mild tenderness and contusion to area above right patella, along quad tendon insertion.  Small abrasion on this area. Full passive ROM of knees bilaterally with normal patellar J tracking bilaterally. No medial or lateral joint line tenderness.  No bony tenderness over patella, fibular head or tibial tuberosity.   No tenderness over MCL, LCL, patellar tendon. Negative Lachman's. Negative posterior drawer test.  Negative McMurray's. No varus or valgus laxity. No crepitus with knee ROM.   Skin:    General: Skin is warm and dry.     Capillary Refill: Capillary refill takes less than 2 seconds.  Neurological:     Mental Status: She is alert and oriented to person, place, and time.     Comments: Sensation and strength intact in the right lower extremity   Psychiatric:        Behavior: Behavior normal.        Thought Content: Thought content normal.      ED Treatments / Results  Labs (all labs ordered are listed, but only abnormal results are displayed) Labs Reviewed - No data to display  EKG None  Radiology Dg Knee Complete 4 Views Right  Result Date: 09/01/2018 CLINICAL DATA:  Larey Seat down stairs 1 week ago. Persistent right knee pain and swelling. Initial encounter. EXAM: RIGHT KNEE - COMPLETE 4+ VIEW COMPARISON:  None. FINDINGS: No evidence of fracture, dislocation, or joint effusion. No evidence of arthropathy or other focal bone abnormality. Soft tissues are unremarkable. IMPRESSION: Negative. Electronically Signed   By: Myles Rosenthal M.D.   On: 09/01/2018 18:26    Procedures Procedures (including critical care time)  Medications Ordered in ED Medications - No data to display   Initial Impression / Assessment and Plan / ED Course  I have reviewed the triage vital signs and the nursing notes.  Pertinent labs & imaging results that were available during my care of the patient were reviewed by me and considered in my medical decision making (see chart for details).       Traumatic right knee pain after a fall.  Exam is reassuring, she has a contusion to the area of quad tendon insertion right above the patella.  Pain likely from soft tissue injury/contusion. No laxity in the joint.  Normal J tracking of the patella not consistent with patellar dislocation.  X-ray is negative, no effusion.  She has a very small abrasion without superimposed infection.  Extremity is neurovascularly intact, and no loss of range of motion.  No distal leg swelling, calf pain.  Will discharge with supportive management including compression, ice, rest, strengthening exercises.  Return discussions given.  Follow-up with PCP/orthopedist for persistent symptoms.  Final Clinical Impressions(s) / ED Diagnoses   Final diagnoses:  Contusion of right knee,  initial encounter    ED Discharge Orders    None       Jerrell Mylar 09/01/18 Eulis Manly, MD 09/01/18 [phone]

## 2018-09-01 NOTE — ED Triage Notes (Addendum)
Fell down 13 steps a week ago. Abrasion and pain to her right knee.

## 2019-03-29 ENCOUNTER — Ambulatory Visit: Payer: Self-pay | Attending: Internal Medicine

## 2019-04-06 IMAGING — DX DG TOE GREAT 2+V*L*
3 series · 3 of 3 positions shown · non-contrast
Comparison: None.

CLINICAL DATA: Injured 3 days ago by tripping and falling.

EXAM:
LEFT GREAT TOE

[toe ap]
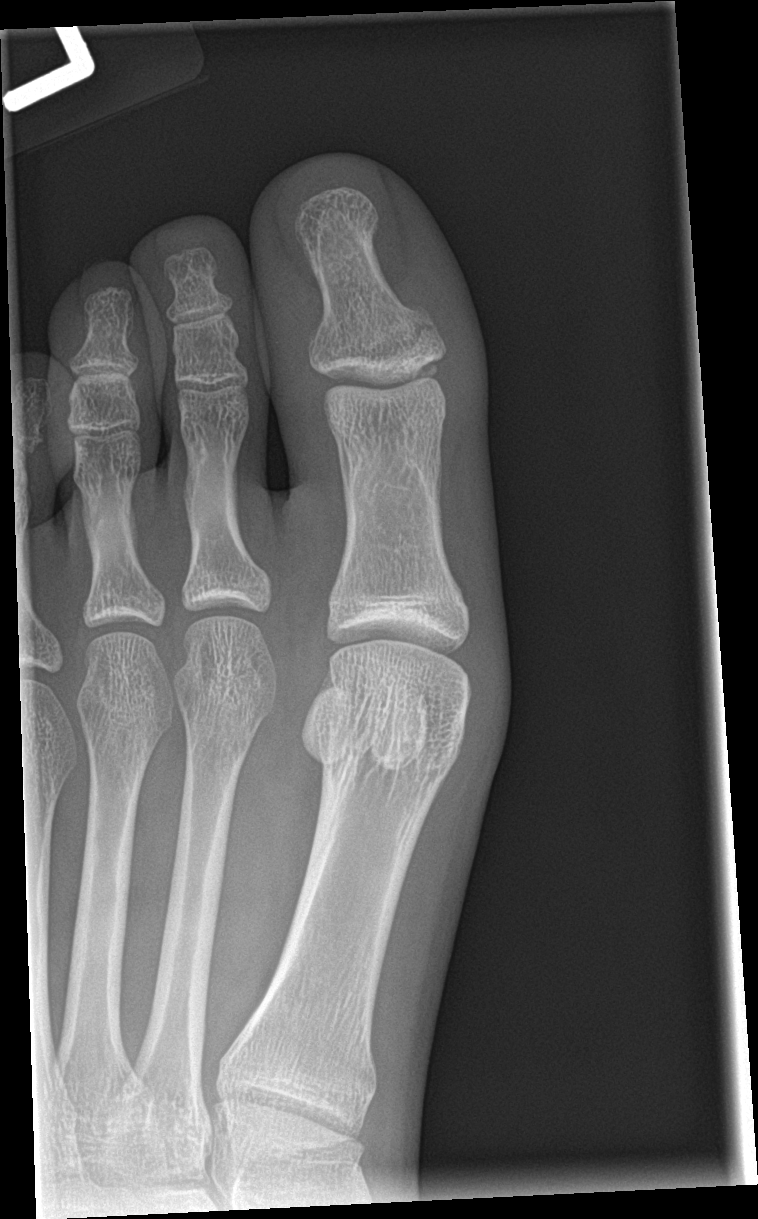

[toe obl]
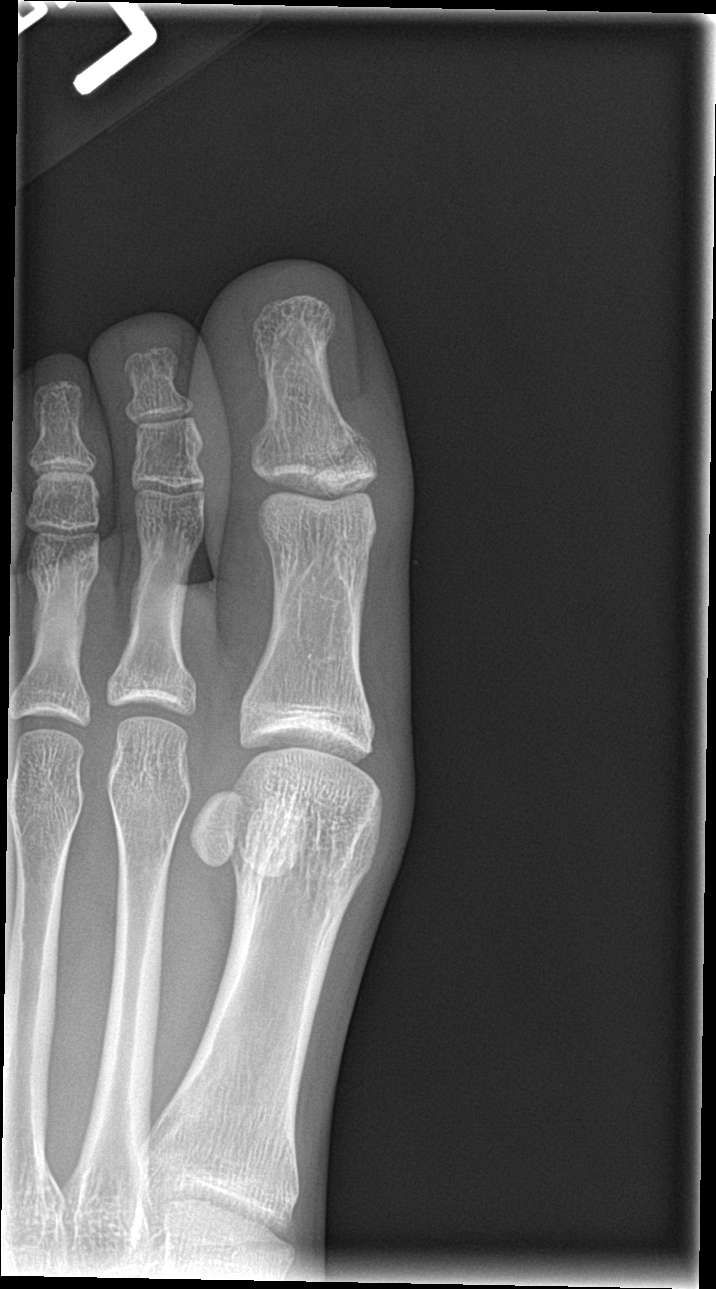

[toe lat]
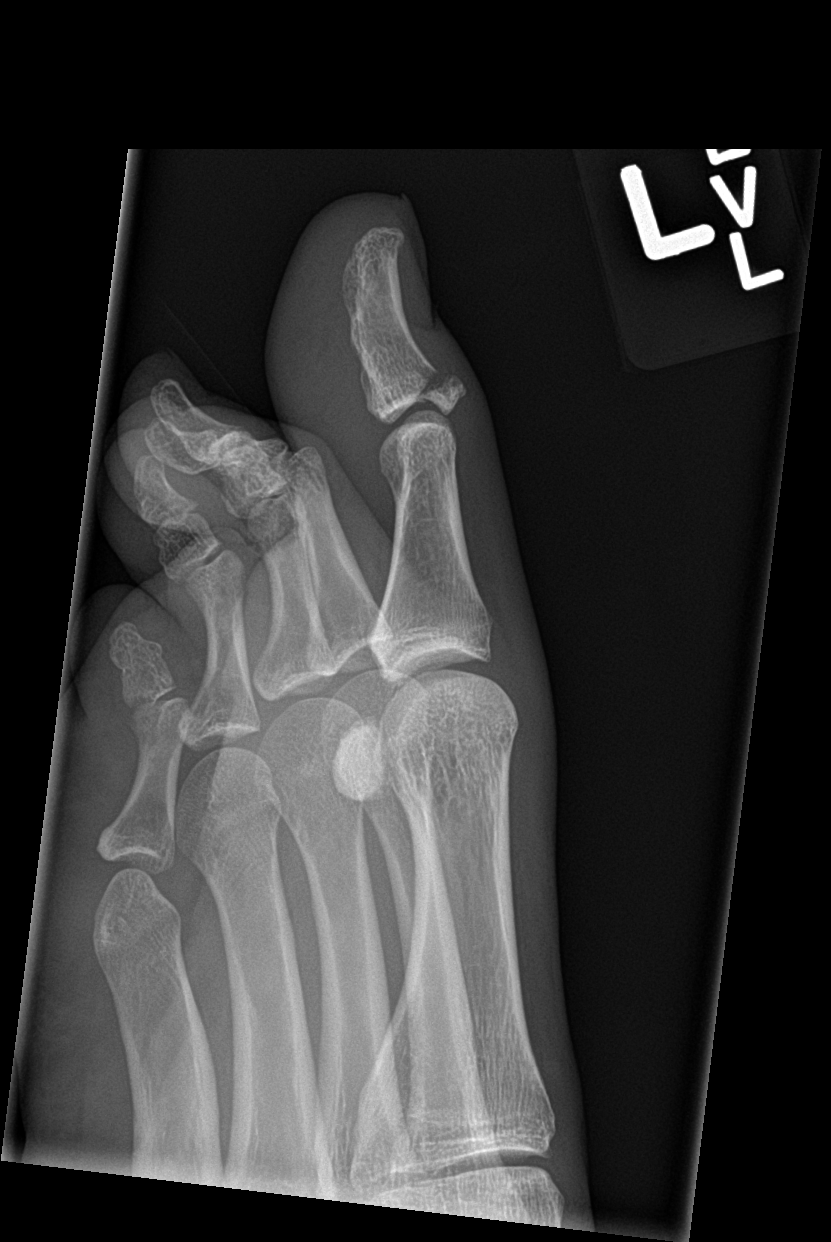

[3 of 3 positions shown; findings below may reference images not displayed]

FINDINGS: There is a fracture of the proximal medial dorsal corner of the
distal phalanx of the great toe, displaced 2 mm.
IMPRESSION: Mildly displaced fracture of the proximal medial dorsal corner of
the distal phalanx of the great toe.

## 2019-06-17 IMAGING — CT CT MAXILLOFACIAL W/O CM
3 of 4 series · 16 of 47 positions shown, 19 images · non-contrast
Comparison: None.

CLINICAL DATA: Recent facial trauma

EXAM:
CT MAXILLOFACIAL WITHOUT CONTRAST
TECHNIQUE: Multidetector CT imaging of the maxillofacial structures was
performed. Multiplanar CT image reconstructions were also generated.

[Series 2: max soft · axial · 0.33mm/px · z∈[-249,-135]mm · 11 of 67 slices shown, 14 images]
[im 5/67  brain]
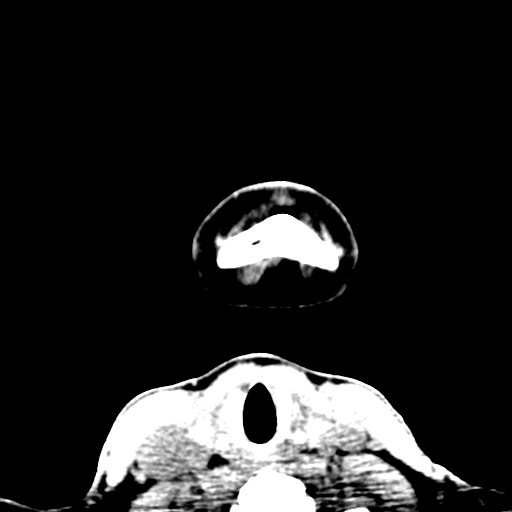
[im 5/67  bone]
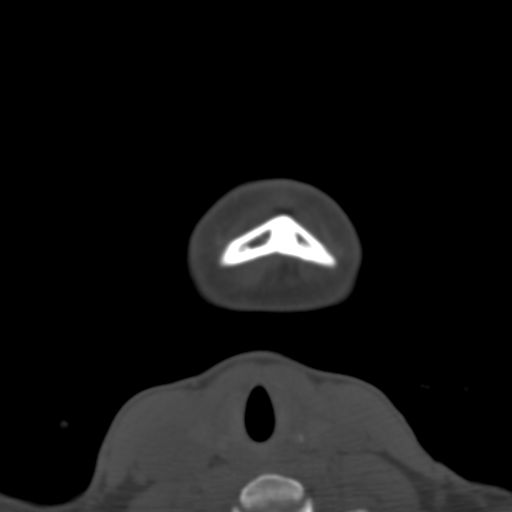
[im 10/67  bone]
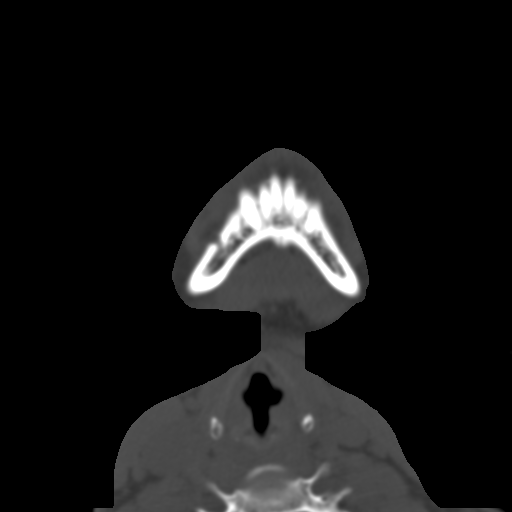
[im 16/67  bone]
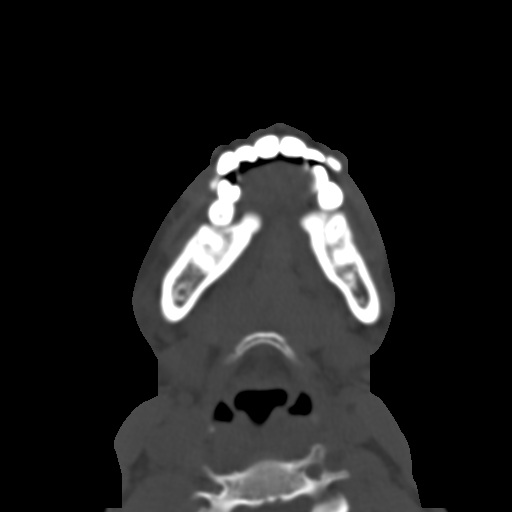
[im 21/67  bone]
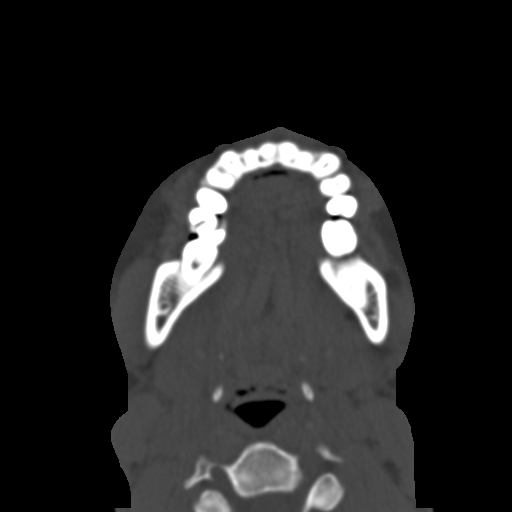
[im 28/67  brain]
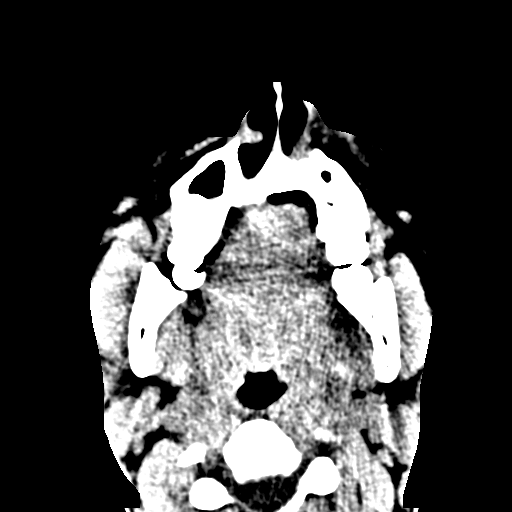
[im 28/67  bone]
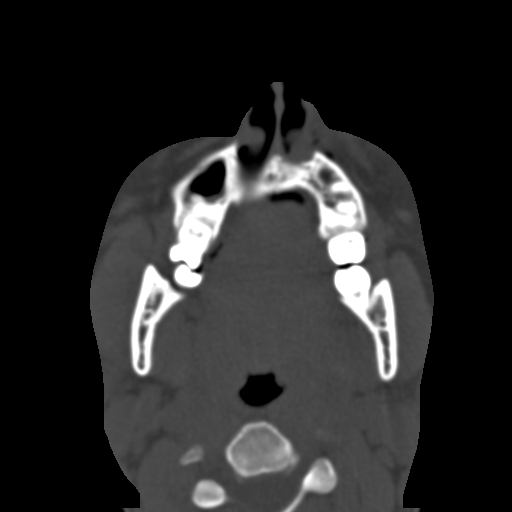
[im 35/67  bone]
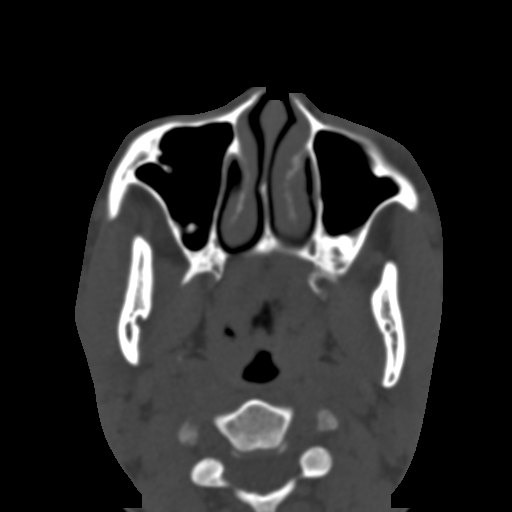
[im 39/67  bone]
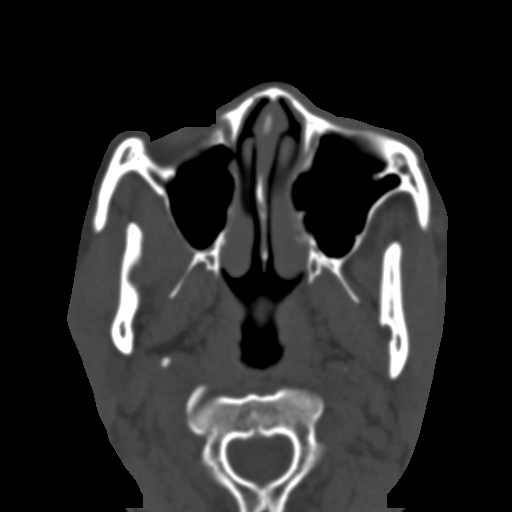
[im 46/67  bone]
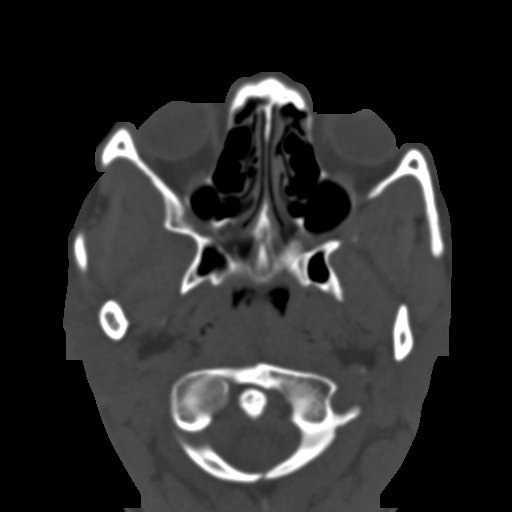
[im 51/67  brain]
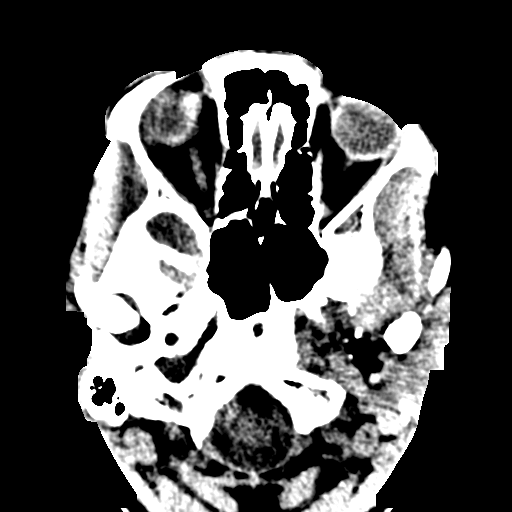
[im 51/67  bone]
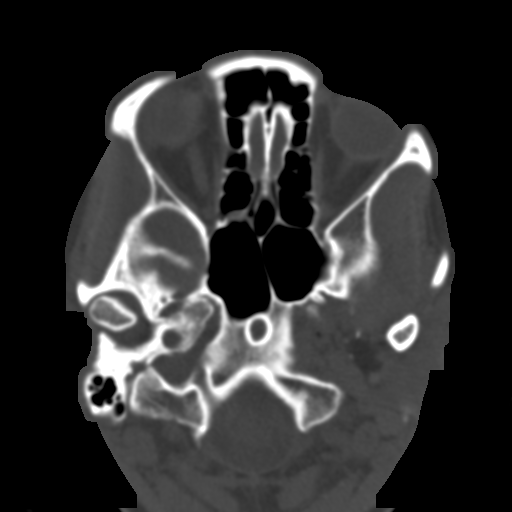
[im 57/67  bone]
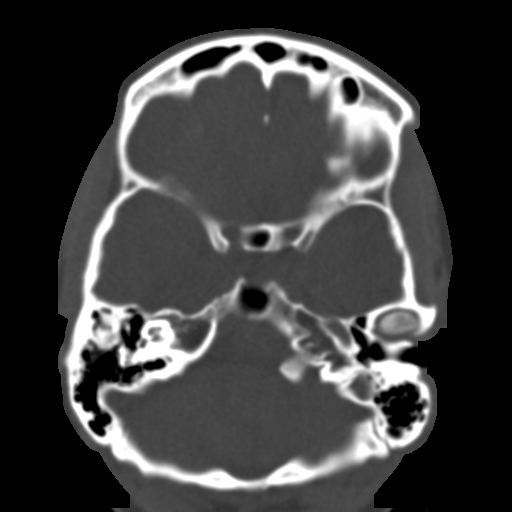
[im 62/67  bone]
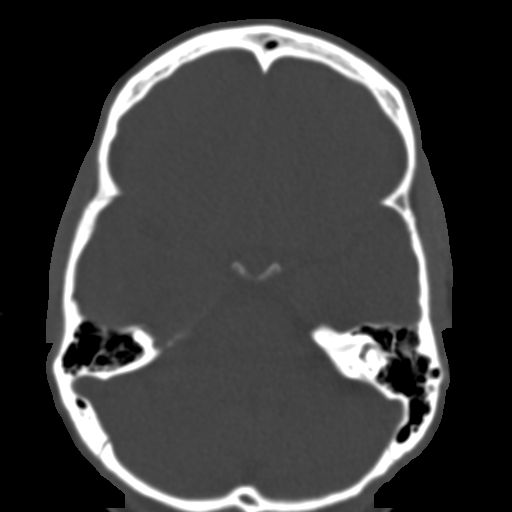

[Series 7: sagittal soft · sagittal · 0.32mm/px · 3 of 69 slices shown]
[im 23/69  bone]
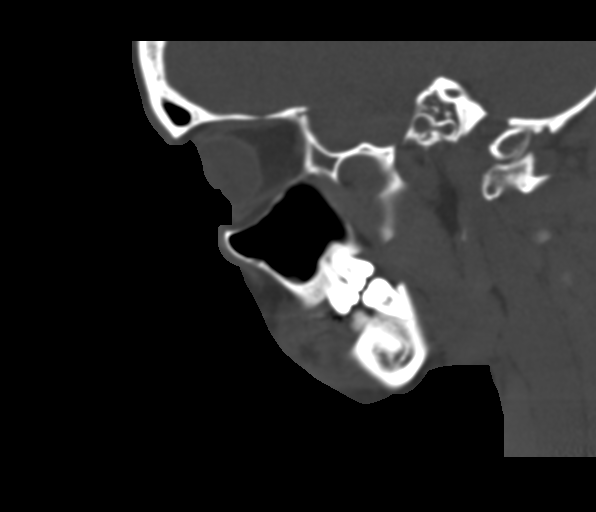
[im 35/69  bone]
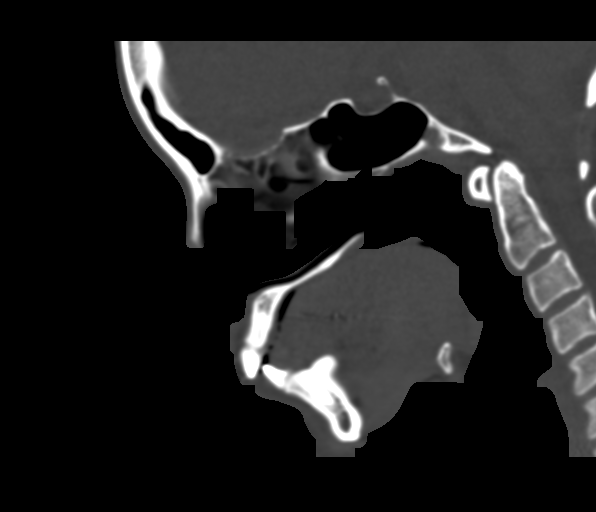
[im 46/69  bone]
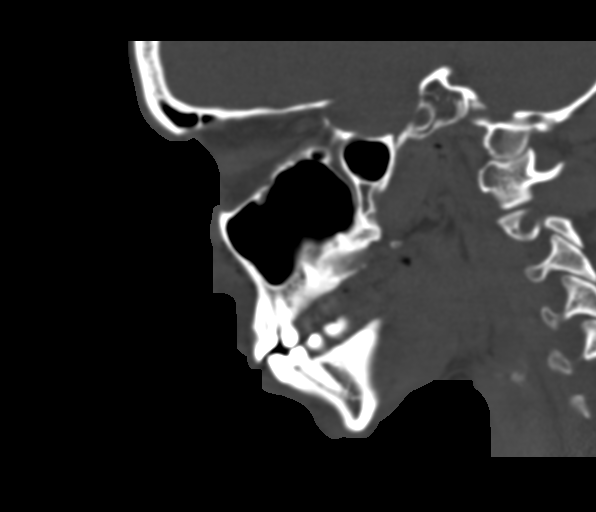

[Series 8: coronal bone · coronal · 0.37mm/px · 2 of 57 slices shown]
[im 19/57  bone]
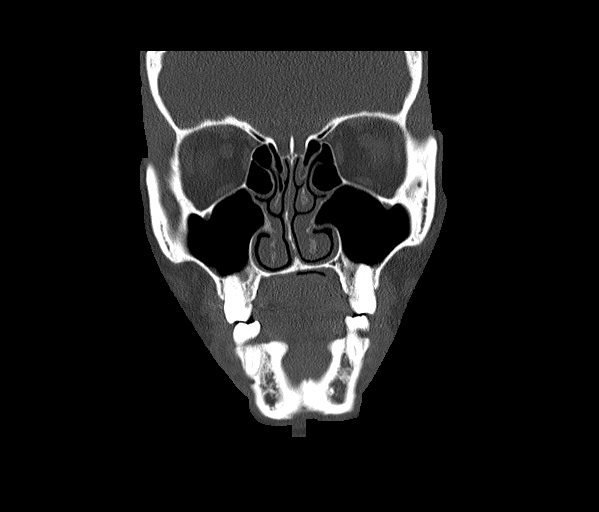
[im 38/57  bone]
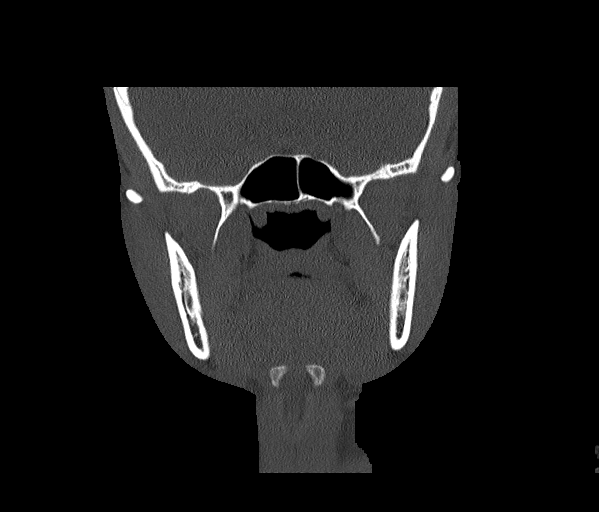

[16 of 47 positions shown; findings below may reference images not displayed]

FINDINGS: Osseous: Bony structures show no evidence of acute fracture.

Orbits: The orbits and their contents are within normal limits.

Sinuses: Paranasal sinuses are well aerated. No air-fluid levels are
seen. No definitive fractures involving the sinuses are seen.

Soft tissues: Mild soft tissue swelling is noted over the left cheek
with a minimal amount of subcutaneous air although again no
definitive bony injury is noted.

Limited intracranial: Within normal limits.
IMPRESSION: Mild soft tissue swelling is noted related to the recent injury. No
definitive acute bony abnormality is seen.

## 2020-02-05 ENCOUNTER — Encounter (HOSPITAL_BASED_OUTPATIENT_CLINIC_OR_DEPARTMENT_OTHER): Payer: Self-pay | Admitting: Emergency Medicine

## 2020-02-05 ENCOUNTER — Other Ambulatory Visit: Payer: Self-pay

## 2020-02-05 ENCOUNTER — Ambulatory Visit (HOSPITAL_BASED_OUTPATIENT_CLINIC_OR_DEPARTMENT_OTHER)
Admission: EM | Admit: 2020-02-05 | Discharge: 2020-02-06 | Disposition: A | Discharge status: Home / Self Care | Payer: Self-pay | Attending: Otolaryngology | Admitting: Otolaryngology

## 2020-02-05 DIAGNOSIS — S01551A Open bite of lip, initial encounter: Secondary | ICD-10-CM | POA: Insufficient documentation

## 2020-02-05 DIAGNOSIS — Z9889 Other specified postprocedural states: Secondary | ICD-10-CM

## 2020-02-05 DIAGNOSIS — W540XXA Bitten by dog, initial encounter: Secondary | ICD-10-CM

## 2020-02-05 DIAGNOSIS — S0185XA Open bite of other part of head, initial encounter: Secondary | ICD-10-CM

## 2020-02-05 DIAGNOSIS — S01511A Laceration without foreign body of lip, initial encounter: Secondary | ICD-10-CM

## 2020-02-05 DIAGNOSIS — Z20822 Contact with and (suspected) exposure to covid-19: Secondary | ICD-10-CM | POA: Insufficient documentation

## 2020-02-05 NOTE — ED Triage Notes (Addendum)
Reports being bit in the face by a pitt bull.  Top lip noted to be gashed open and bleeding.  Unsure if dogs shots are up to date.

## 2020-02-05 NOTE — ED Notes (Signed)
EDP at bedside  

## 2020-02-06 ENCOUNTER — Emergency Department (HOSPITAL_COMMUNITY): Payer: Self-pay | Admitting: Anesthesiology

## 2020-02-06 ENCOUNTER — Encounter (HOSPITAL_COMMUNITY): Payer: Self-pay | Admitting: Anesthesiology

## 2020-02-06 ENCOUNTER — Encounter (HOSPITAL_COMMUNITY)
Admission: EM | Disposition: A | Discharge status: Home / Self Care | Payer: Self-pay | Source: Home / Self Care | Attending: Emergency Medicine

## 2020-02-06 DIAGNOSIS — Z9889 Other specified postprocedural states: Secondary | ICD-10-CM

## 2020-02-06 HISTORY — PX: FACIAL LACERATION REPAIR: SHX6589

## 2020-02-06 LAB — RESPIRATORY PANEL BY RT PCR (FLU A&B, COVID)
Influenza A by PCR: NEGATIVE
Influenza B by PCR: NEGATIVE
SARS Coronavirus 2 by RT PCR: NEGATIVE

## 2020-02-06 SURGERY — REPAIR, LACERATION, FACE
Anesthesia: General | Site: Face

## 2020-02-06 MED ORDER — BACITRACIN-NEOMYCIN-POLYMYXIN OINTMENT TUBE
TOPICAL_OINTMENT | CUTANEOUS | Status: AC
  Filled 2020-02-06: qty 14.17

## 2020-02-06 MED ORDER — LIDOCAINE-EPINEPHRINE-TETRACAINE (LET) TOPICAL GEL
3.0000 mL | Freq: Once | TOPICAL | Status: AC
  Administered 2020-02-06: 3 mL via TOPICAL
  Filled 2020-02-06: qty 3

## 2020-02-06 MED ORDER — OXYCODONE HCL 5 MG/5ML PO SOLN: 5.0000 mg | Freq: Once | ORAL | Status: DC | PRN

## 2020-02-06 MED ORDER — ONDANSETRON HCL 4 MG/2ML IJ SOLN
INTRAMUSCULAR | Status: DC | PRN
  Administered 2020-02-06: 4 mg via INTRAVENOUS

## 2020-02-06 MED ORDER — LACTATED RINGERS IV SOLN: INTRAVENOUS | Status: DC | PRN

## 2020-02-06 MED ORDER — PROPOFOL 10 MG/ML IV BOLUS
INTRAVENOUS | Status: AC
  Filled 2020-02-06: qty 20

## 2020-02-06 MED ORDER — ACETAMINOPHEN 10 MG/ML IV SOLN
INTRAVENOUS | Status: AC
  Filled 2020-02-06: qty 100

## 2020-02-06 MED ORDER — HYDROMORPHONE HCL 1 MG/ML IJ SOLN: 0.2500 mg | INTRAMUSCULAR | Status: DC | PRN

## 2020-02-06 MED ORDER — FENTANYL CITRATE (PF) 250 MCG/5ML IJ SOLN
INTRAMUSCULAR | Status: AC
  Filled 2020-02-06: qty 5

## 2020-02-06 MED ORDER — PROPOFOL 10 MG/ML IV BOLUS
INTRAVENOUS | Status: DC | PRN
  Administered 2020-02-06: 150 mg via INTRAVENOUS

## 2020-02-06 MED ORDER — SUCCINYLCHOLINE CHLORIDE 200 MG/10ML IV SOSY
PREFILLED_SYRINGE | INTRAVENOUS | Status: DC | PRN
  Administered 2020-02-06: 60 mg via INTRAVENOUS

## 2020-02-06 MED ORDER — KETOROLAC TROMETHAMINE 60 MG/2ML IM SOLN
30.0000 mg | Freq: Once | INTRAMUSCULAR | Status: AC
  Administered 2020-02-06: 30 mg via INTRAMUSCULAR
  Filled 2020-02-06: qty 2

## 2020-02-06 MED ORDER — PROMETHAZINE HCL 25 MG/ML IJ SOLN: 6.2500 mg | INTRAMUSCULAR | Status: DC | PRN

## 2020-02-06 MED ORDER — BACITRACIN ZINC 500 UNIT/GM EX OINT
TOPICAL_OINTMENT | CUTANEOUS | Status: AC
  Filled 2020-02-06: qty 28.35

## 2020-02-06 MED ORDER — DEXAMETHASONE SODIUM PHOSPHATE 10 MG/ML IJ SOLN
INTRAMUSCULAR | Status: DC | PRN
  Administered 2020-02-06: 5 mg via INTRAVENOUS

## 2020-02-06 MED ORDER — BACITRACIN-NEOMYCIN-POLYMYXIN 400-5-5000 EX OINT
TOPICAL_OINTMENT | CUTANEOUS | Status: DC | PRN
  Administered 2020-02-06: 1 via TOPICAL

## 2020-02-06 MED ORDER — HYDROMORPHONE HCL 1 MG/ML IJ SOLN
INTRAMUSCULAR | Status: AC
  Filled 2020-02-06: qty 1

## 2020-02-06 MED ORDER — FENTANYL CITRATE (PF) 100 MCG/2ML IJ SOLN
INTRAMUSCULAR | Status: DC | PRN
  Administered 2020-02-06: 100 ug via INTRAVENOUS

## 2020-02-06 MED ORDER — OXYCODONE HCL 5 MG PO TABS: 5.0000 mg | ORAL_TABLET | Freq: Once | ORAL | Status: DC | PRN

## 2020-02-06 MED ORDER — SODIUM CHLORIDE 0.9 % IV SOLN
3.0000 g | INTRAVENOUS | Status: AC
  Administered 2020-02-06: 3 g via INTRAVENOUS
  Filled 2020-02-06 (×2): qty 8

## 2020-02-06 MED ORDER — LIDOCAINE-EPINEPHRINE 1 %-1:100000 IJ SOLN
INTRAMUSCULAR | Status: AC
  Filled 2020-02-06: qty 1

## 2020-02-06 MED ORDER — PHENYLEPHRINE HCL (PRESSORS) 10 MG/ML IV SOLN
INTRAVENOUS | Status: DC | PRN
  Administered 2020-02-06 (×4): 40 ug via INTRAVENOUS
  Administered 2020-02-06: 80 ug via INTRAVENOUS

## 2020-02-06 MED ORDER — KETOROLAC TROMETHAMINE 30 MG/ML IJ SOLN
INTRAMUSCULAR | Status: AC
  Administered 2020-02-06: 30 mg
  Filled 2020-02-06: qty 1

## 2020-02-06 MED ORDER — PHENYLEPHRINE HCL-NACL 10-0.9 MG/250ML-% IV SOLN
INTRAVENOUS | Status: DC | PRN
  Administered 2020-02-06: 50 ug/min via INTRAVENOUS

## 2020-02-06 MED ORDER — ACETAMINOPHEN 10 MG/ML IV SOLN
1000.0000 mg | Freq: Once | INTRAVENOUS | Status: DC | PRN
  Administered 2020-02-06: 1000 mg via INTRAVENOUS

## 2020-02-06 MED ORDER — LIDOCAINE 2% (20 MG/ML) 5 ML SYRINGE
INTRAMUSCULAR | Status: DC | PRN
  Administered 2020-02-06: 60 mg via INTRAVENOUS

## 2020-02-06 MED ORDER — AMOXICILLIN-POT CLAVULANATE 875-125 MG PO TABS: 1.0000 | ORAL_TABLET | Freq: Two times a day (BID) | ORAL | 0 refills | Status: AC

## 2020-02-06 SURGICAL SUPPLY — 42 items
BLADE SURG 15 STRL LF DISP TIS (BLADE) IMPLANT
BLADE SURG 15 STRL SS (BLADE)
CANISTER SUCT 3000ML PPV (MISCELLANEOUS) ×3 IMPLANT
CLEANER TIP ELECTROSURG 2X2 (MISCELLANEOUS) ×3 IMPLANT
CORD BIPOLAR FORCEPS 12FT (ELECTRODE) IMPLANT
COVER SURGICAL LIGHT HANDLE (MISCELLANEOUS) ×3 IMPLANT
COVER WAND RF STERILE (DRAPES) ×3 IMPLANT
DRAPE HALF SHEET 40X57 (DRAPES) IMPLANT
ELECT COATED BLADE 2.86 ST (ELECTRODE) ×3 IMPLANT
ELECT NEEDLE TIP 2.8 STRL (NEEDLE) IMPLANT
ELECT REM PT RETURN 9FT ADLT (ELECTROSURGICAL) ×3
ELECTRODE REM PT RTRN 9FT ADLT (ELECTROSURGICAL) ×1 IMPLANT
EVACUATOR SILICONE 100CC (DRAIN) IMPLANT
GLOVE BIO SURGEON STRL SZ7 (GLOVE) ×3 IMPLANT
GOWN STRL REUS W/ TWL LRG LVL3 (GOWN DISPOSABLE) ×2 IMPLANT
GOWN STRL REUS W/TWL LRG LVL3 (GOWN DISPOSABLE) ×4
KIT BASIN OR (CUSTOM PROCEDURE TRAY) ×3 IMPLANT
KIT TURNOVER KIT B (KITS) ×3 IMPLANT
LOCATOR NERVE 3 VOLT (DISPOSABLE) IMPLANT
NEEDLE HYPO 25GX1X1/2 BEV (NEEDLE) IMPLANT
NS IRRIG 1000ML POUR BTL (IV SOLUTION) ×3 IMPLANT
PAD ARMBOARD 7.5X6 YLW CONV (MISCELLANEOUS) ×6 IMPLANT
PENCIL SMOKE EVACUATOR (MISCELLANEOUS) ×3 IMPLANT
STAPLER VISISTAT 35W (STAPLE) IMPLANT
SUT CHROMIC 4 0 P 3 18 (SUTURE) IMPLANT
SUT ETHILON 3 0 PS 1 (SUTURE) IMPLANT
SUT ETHILON 5 0 P 3 18 (SUTURE)
SUT ETHILON 5 0 PS 2 18 (SUTURE) IMPLANT
SUT NYLON ETHILON 5-0 P-3 1X18 (SUTURE) IMPLANT
SUT PLAIN 5 0 P 3 18 (SUTURE) IMPLANT
SUT PROLENE 5 0 PS 2 (SUTURE) ×6 IMPLANT
SUT SILK 2 0 PERMA HAND 18 BK (SUTURE) IMPLANT
SUT SILK 3 0 REEL (SUTURE) IMPLANT
SUT VIC AB 3-0 PS2 18 (SUTURE)
SUT VIC AB 3-0 PS2 18XBRD (SUTURE) IMPLANT
SUT VIC AB 4-0 P-3 18X BRD (SUTURE) IMPLANT
SUT VIC AB 4-0 P3 18 (SUTURE)
SUT VIC AB 4-0 RB1 27 (SUTURE) ×2
SUT VIC AB 4-0 RB1 27X BRD (SUTURE) ×1 IMPLANT
TOWEL GREEN STERILE FF (TOWEL DISPOSABLE) ×3 IMPLANT
TRAY ENT MC OR (CUSTOM PROCEDURE TRAY) ×3 IMPLANT
WATER STERILE IRR 1000ML POUR (IV SOLUTION) ×3 IMPLANT

## 2020-02-06 NOTE — ED Notes (Signed)
The pt is upset her girlfried as left they were arguing  A little while ago or permit signed pt undressed and placed in ahospital gown

## 2020-02-06 NOTE — ED Notes (Signed)
Surgeon here to see  Nose ring removed  Girlfriend kept the nose ring

## 2020-02-06 NOTE — Anesthesia Postprocedure Evaluation (Signed)
Anesthesia Post Note  Patient: Taylor Mckee  Procedure(s) Performed: FACIAL LACERATION REPAIR (N/A Face)     Patient location during evaluation: PACU Anesthesia Type: General Level of consciousness: awake Pain management: pain level controlled Vital Signs Assessment: post-procedure vital signs reviewed and stable Respiratory status: spontaneous breathing, nonlabored ventilation, respiratory function stable and patient connected to nasal cannula oxygen Cardiovascular status: blood pressure returned to baseline and stable Postop Assessment: no apparent nausea or vomiting Anesthetic complications: no   No complications documented.  Last Vitals:  Vitals:   02/06/20 0630 02/06/20 0635  BP: 120/89   Pulse: 81 84  Resp: 11 15  Temp: 36.4 C   SpO2: 98% 100%    Last Pain:  Vitals:   02/06/20 0630  TempSrc:   PainSc: 2                  Dixie Coppa P Uziah Sorter

## 2020-02-06 NOTE — Op Note (Signed)
Procedure Note  Taylor Mckee female 29 y.o. 02/06/2020  Procedure(s) and Anesthesia Type:    * FACIAL LACERATION REPAIR, complex - General  Surgeon(s) and Role:    * Rejeana Brock, MD - Primary       Surgeon: Rejeana Brock   Assistants: none  Anesthesia: General endotracheal anesthesia  ASA Class: 1    Procedure Detail  FACIAL LACERATION REPAIR  After informed consent was obtained the patient was taken to the operating room and transferred to the table in the supine position.  General anesthesia was established and she was orotracheally intubated without difficulty.  Her eyes were taped.  Her face was prepped using a Betadine solution.  I first cleaned the lip wound thoroughly with Betadine scrub.  This was then cleaned free of the skin and mucosa using sterile saline irrigations.  The upper lip was macerated and there was missing tissue from the dog bite.  Primary goal is to reestablish the apiculate resource as well as the vermilion border of the lip.  This was accomplished using a combination of deep 4-0 Vicryl sutures and interrupted 5-0 Prolene to close the skin.  The closure turned out better than I had expected.  The vermilion was well approximated and the immediate cosmetic result excellent.  Patient's care was turned back to anesthesia for wake up and transported to the PACU.  Estimated Blood Loss:  Minimal         Drains: none        Complications:  * No complications entered in OR log *         Disposition: PACU - hemodynamically stable.         Condition: stable

## 2020-02-06 NOTE — Anesthesia Procedure Notes (Signed)
Procedure Name: Intubation Date/Time: 02/06/2020 4:13 AM Performed by: Edmonia Caprio, CRNA Pre-anesthesia Checklist: Patient identified, Emergency Drugs available, Suction available, Patient being monitored and Timeout performed Patient Re-evaluated:Patient Re-evaluated prior to induction Oxygen Delivery Method: Circle system utilized Preoxygenation: Pre-oxygenation with 100% oxygen Induction Type: IV induction and Rapid sequence Laryngoscope Size: Glidescope and 3 Grade View: Grade I Tube type: Oral Tube size: 7.0 mm Number of attempts: 1 Airway Equipment and Method: Stylet and Video-laryngoscopy Placement Confirmation: ETT inserted through vocal cords under direct vision,  positive ETCO2 and breath sounds checked- equal and bilateral Secured at: 23 cm Tube secured with: Tape Dental Injury: Teeth and Oropharynx as per pre-operative assessment

## 2020-02-06 NOTE — Anesthesia Preprocedure Evaluation (Addendum)
Anesthesia Evaluation  Patient identified by MRN, date of birth, ID band Patient awake    Reviewed: Allergy & Precautions, NPO status , Patient's Chart, lab work & pertinent test results  Airway Mallampati: III  TM Distance: >3 FB Neck ROM: Full    Dental no notable dental hx.    Pulmonary neg pulmonary ROS,    Pulmonary exam normal breath sounds clear to auscultation       Cardiovascular negative cardio ROS Normal cardiovascular exam Rhythm:Regular Rate:Normal     Neuro/Psych negative neurological ROS  negative psych ROS   GI/Hepatic GERD  ,(+)     substance abuse  ,   Endo/Other  negative endocrine ROS  Renal/GU negative Renal ROS     Musculoskeletal negative musculoskeletal ROS (+)   Abdominal   Peds  Hematology negative hematology ROS (+)   Anesthesia Other Findings Dog Bite  Reproductive/Obstetrics Pregnancy test offered to and declined by patient                           Anesthesia Physical Anesthesia Plan  ASA: II and emergent  Anesthesia Plan: General   Post-op Pain Management:    Induction: Intravenous  PONV Risk Score and Plan: 3 and Ondansetron, Dexamethasone, Midazolam and Treatment may vary due to age or medical condition  Airway Management Planned: Video Laryngoscope Planned and Nasal ETT  Additional Equipment:   Intra-op Plan:   Post-operative Plan: Extubation in OR  Informed Consent: I have reviewed the patients History and Physical, chart, labs and discussed the procedure including the risks, benefits and alternatives for the proposed anesthesia with the patient or authorized representative who has indicated his/her understanding and acceptance.     Dental advisory given  Plan Discussed with: CRNA  Anesthesia Plan Comments:        Anesthesia Quick Evaluation

## 2020-02-06 NOTE — H&P (Signed)
Subjective:     Taylor Mckee is a 29 y.o. female transferred from an outside hospital to see an ENT surgeon for management of a dog bite to the face.  This happened approximately 8 hours ago when she was bitten by a pit bull.  Per patient report, the pit bull was up-to-date on his shots.  The patient presented to an urgent care center who transferred her to [facility].  They requested that she see a plastic surgeon.  I made them aware prior to the transfer that I was an ENT surgeon and will be happy to care for as I was on for face trauma.  I asked them to make her aware that I was not in fact a Engineer, [employer].  She was okay with that and went ahead with the transfer asking that I close her wound.  Patient History:  The following portions of the patient's history were reviewed and updated as appropriate: allergies, current medications, past family history, past medical history, past social history, past surgical history and problem list.  Review of Systems Pertinent items are noted in HPI.    Objective:    BP (!) 125/91   Pulse 71   Temp 97.9 F (36.6 C) (Axillary)   Resp 16   Ht 5\' 4"  (1.626 m)   Wt 53.5 kg   SpO2 97%   BMI 20.25 kg/m   General:  alert and cooperative  Skin:  normal and no rash or abnormalities  Eyes: conjunctivae/corneas clear. PERRL, EOM's intact. Fundi benign.  Mouth: MMM no lesions, thrush  Lymph Nodes:  Cervical, supraclavicular, and axillary nodes normal.  Lungs:  clear to auscultation bilaterally  Heart:  regular rate and rhythm, S1, S2 normal, no murmur, click, rub or gallop  Abdomen:   CVA:    Genitourinary:   Extremities:  extremities normal, atraumatic, no cyanosis or edema  Neurologic:  Alert and oriented x3. Gait normal. Reflexes and motor strength normal and symmetric. Cranial nerves 2-12 and sensation grossly intact.  Psychiatric:  normal mood, behavior, speech, dress, and thought processes   Patient had a near through and through  upper lip laceration involving the vermilion extending up to the base of the nose.  The laceration was stellate.  There was a piece of her upper lip including the vermilion which was almost completely disconnected and appeared devitalized.  There was a significant cosmetic deformity involving the upper lip and the orbicularis oris muscle was exposed.  Assessment:   Dog bite to face - complex   Plan:   This patient needs to go to the operating room for washout and closure of this wound.  She will need postoperative antibiotics.  This is a complicated and macerated wound involving the vermilion border.  Even with a perfect closure, I would expect her to have a significant scar.  A primary goal will be to ensure oral competency and try and prevent infection.  In my consent process I was clear with her about the challenges around a cosmetic result as well as the risk of ongoing infection and the need for another procedure.  I explained to her that her upper lip will not look the same as it did prior to her dog bite.  She voiced an understanding as did her girlfriend who was at the bedside.

## 2020-02-06 NOTE — Discharge Instructions (Signed)
Animal Bite, Adult Animal bite wounds can be mild or serious. It is important to get medical treatment to prevent infection. Ask your doctor if you need treatment to prevent an infection that can spread from animals to humans (rabies). Follow these instructions at home: Wound care   Follow instructions from your doctor about how to take care of your wound. Make sure you: ? Wash your hands with soap and water before you change your bandage (dressing). If you cannot use soap and water, use hand sanitizer. ? Change your bandage as told by your doctor. ? Leave stitches (sutures), skin glue, or skin tape (adhesive) strips in place. They may need to stay in place for 2 weeks or longer. If tape strips get loose and curl up, you may trim the loose edges. Do not remove tape strips completely unless your doctor says it is okay.  Check your wound every day for signs of infection. Check for: ? More redness, swelling, or pain. ? More fluid or blood. ? Warmth. ? Pus or a bad smell. Medicines  Take or apply over-the-counter and prescription medicines only as told by your doctor.  If you were prescribed an antibiotic, take or apply it as told by your doctor. Do not stop using the antibiotic even if your wound gets better. General instructions   Keep the injured area raised (elevated) above the level of your heart while you are sitting or lying down.  If directed, put ice on the injured area. ? Put ice in a plastic bag. ? Place a towel between your skin and the bag. ? Leave the ice on for 20 minutes, 2-3 times per day.  Keep all follow-up visits as told by your doctor. This is important. Contact a doctor if:  You have more redness, swelling, or pain around your wound.  Your wound feels warm to the touch.  You have a fever or chills.  You have a general feeling of sickness (malaise).  You feel sick to your stomach (nauseous).  You throw up (vomit).  You have pain that does not get  better. Get help right away if:  You have a red streak going away from your wound.  You have any of these coming from your wound: ? Non-clear fluid. ? More blood. ? Pus or a bad smell.  You have trouble moving your injured area.  You lose feeling (have numbness) or feel tingling anywhere on your body. Summary  It is important to get the right medical treatment for animal bites. Treatment can help you to not get an infection. Ask your doctor if you need treatment to prevent an infection that can spread from animals to humans (rabies).  Check your wound every day for signs of infection, such as more redness or swelling instead of less.  If you have a red streak going away from your wound, get medical help right away. This information is not intended to replace advice given to you by your health care provider. Make sure you discuss any questions you have with your health care provider. Document Revised: 03/13/2017 Document Reviewed: 09/26/2016 Elsevier Patient Education  2020 [employer]  General Anesthesia, Adult, Care After This sheet gives you information about how to care for yourself after your procedure. Your health care provider may also give you more specific instructions. If you have problems or questions, contact your health care provider. What can I expect after the procedure? After the procedure, the following side effects are common:  Pain or  discomfort at the IV site.  Nausea.  Vomiting.  Sore throat.  Trouble concentrating.  Feeling cold or chills.  Weak or tired.  Sleepiness and fatigue.  Soreness and body aches. These side effects can affect parts of the body that were not involved in surgery. Follow these instructions at home:  For at least 24 hours after the procedure:  Have a responsible adult stay with you. It is important to have someone help care for you until you are awake and alert.  Rest as needed.  Do not: ? Participate in activities  in which you could fall or become injured. ? Drive. ? Use heavy machinery. ? Drink alcohol. ? Take sleeping pills or medicines that cause drowsiness. ? Make important decisions or sign legal documents. ? Take care of children on your own. Eating and drinking  Follow any instructions from your health care provider about eating or drinking restrictions.  When you feel hungry, start by eating small amounts of foods that are soft and easy to digest (bland), such as toast. Gradually return to your regular diet.  Drink enough fluid to keep your urine pale yellow.  If you vomit, rehydrate by drinking water, juice, or clear broth. General instructions  If you have sleep apnea, surgery and certain medicines can increase your risk for breathing problems. Follow instructions from your health care provider about wearing your sleep device: ? Anytime you are sleeping, including during daytime naps. ? While taking prescription pain medicines, sleeping medicines, or medicines that make you drowsy.  Return to your normal activities as told by your health care provider. Ask your health care provider what activities are safe for you.  Take over-the-counter and prescription medicines only as told by your health care provider.  If you smoke, do not smoke without supervision.  Keep all follow-up visits as told by your health care provider. This is important. Contact a health care provider if:  You have nausea or vomiting that does not get better with medicine.  You cannot eat or drink without vomiting.  You have pain that does not get better with medicine.  You are unable to pass urine.  You develop a skin rash.  You have a fever.  You have redness around your IV site that gets worse. Get help right away if:  You have difficulty breathing.  You have chest pain.  You have blood in your urine or stool, or you vomit blood. Summary  After the procedure, it is common to have a sore throat  or nausea. It is also common to feel tired.  Have a responsible adult stay with you for the first 24 hours after general anesthesia. It is important to have someone help care for you until you are awake and alert.  When you feel hungry, start by eating small amounts of foods that are soft and easy to digest (bland), such as toast. Gradually return to your regular diet.  Drink enough fluid to keep your urine pale yellow.  Return to your normal activities as told by your health care provider. Ask your health care provider what activities are safe for you. This information is not intended to replace advice given to you by your health care provider. Make sure you discuss any questions you have with your health care provider. Document Revised: 03/21/2017 Document Reviewed: 11/01/2016 Elsevier Patient Education  2020 [employer].

## 2020-02-06 NOTE — ED Provider Notes (Signed)
Brief update note  See Cardama's note for full EDP note  I saw and evaluated patient. Complex dogbite laceration to lip. Max/face Elijah Birk will take to OR this morning.    Milagros Loll, MD 02/06/20 [phone]

## 2020-02-06 NOTE — Progress Notes (Signed)

## 2020-02-06 NOTE — ED Provider Notes (Signed)
[facility] EMERGENCY DEPARTMENT Provider Note  CSN: [id number] Arrival date & time: 02/05/20 2214  Chief Complaint(s) Animal Bite  HPI [place] is a 29 y.o. female   The history is provided by the patient.  Animal Bite Contact animal:  Dog Location:  Face Facial injury location:  Upper lip Time since incident: 1-2 hrs ago. Pain details:    Quality:  Aching   Severity:  Moderate   Timing:  Constant Incident location:  Home Provoked: unprovoked   Notifications:  None Animal's rabies vaccination status:  Unknown (owner reported that it was, but patient is unsure.) Animal captured: with owner.   Tetanus status:  Up to date   Past Medical History Past Medical History:  Diagnosis Date  . Acid reflux    There are no problems to display for this patient.  Home Medication(s) Prior to Admission medications   Medication Sig Start Date End Date Taking? Authorizing Provider  amoxicillin-clavulanate (AUGMENTIN) 875-125 MG tablet Take 1 tablet by mouth 2 (two) times daily. One po bid x 7 days 04/23/17   Loren Racer, MD  HYDROcodone-acetaminophen [employer] [facility] [employer]) 5-325 MG tablet Take 1 tablet by mouth every 6 (six) hours as needed for severe pain. 04/23/17   Loren Racer, MD                                                                                                                                    Past Surgical History Past Surgical History:  Procedure Laterality Date  . NO PAST SURGERIES     Family History No family history on file.  Social History Social History   Tobacco Use  . Smoking status: Never Smoker  . Smokeless tobacco: Never Used  Substance Use Topics  . Alcohol use: Yes    Comment: occ  . Drug use: No   Allergies Patient has no known allergies.  Review of Systems Review of Systems All other systems are reviewed and are negative for acute change except as noted in the HPI  Physical Exam Vital Signs  I have reviewed the  triage vital signs BP (!) 125/91   Pulse 71   Temp 97.9 F (36.6 C) (Axillary)   Resp 16   Ht 5\' 4"  (1.626 m)   Wt 53.5 kg   SpO2 97%   BMI 20.25 kg/m   Physical Exam Vitals reviewed.  Constitutional:      General: She is not in acute distress.    Appearance: She is well-developed. She is not diaphoretic.  HENT:     Head: Normocephalic and atraumatic.      Right Ear: External ear normal.     Left Ear: External ear normal.     Nose: Nose normal.  Eyes:     General: No scleral icterus.    Conjunctiva/sclera: Conjunctivae normal.  Neck:     Trachea: Phonation normal.  Cardiovascular:     Rate and Rhythm:  Normal rate and regular rhythm.  Pulmonary:     Effort: Pulmonary effort is normal. No respiratory distress.     Breath sounds: No stridor.  Abdominal:     General: There is no distension.  Musculoskeletal:        General: Normal range of motion.     Cervical back: Normal range of motion.  Neurological:     Mental Status: She is alert and oriented to person, place, and time.  Psychiatric:        Behavior: Behavior normal.       ED Results and Treatments Labs (all labs ordered are listed, but only abnormal results are displayed) Labs Reviewed - No data to display                                                                                                                       EKG  EKG Interpretation  Date/Time:    Ventricular Rate:    PR Interval:    QRS Duration:   QT Interval:    QTC Calculation:   R Axis:     Text Interpretation:        Radiology No results found.  Pertinent labs & imaging results that were available during my care of the patient were reviewed by me and considered in my medical decision making (see chart for details).  Medications Ordered in ED Medications  lidocaine-EPINEPHrine-tetracaine (LET) topical gel (3 mLs Topical Given 02/06/20 0008)  ketorolac (TORADOL) injection 30 mg (30 mg Intramuscular Given 02/06/20 0012)                                                                                                                                     Procedures Procedures  (including critical care time)  Medical Decision Making / ED Course I have reviewed the nursing notes for this encounter and the patient's prior records (if available in EHR or on provided paperwork).   [name] was evaluated in Emergency Department on 02/06/2020 for the symptoms described in the history of present illness. She was evaluated in the context of the global COVID-19 pandemic, which necessitated consideration that the patient might be at risk for infection with the SARS-CoV-2 virus that causes COVID-19. Institutional protocols and algorithms that pertain to the evaluation of patients at risk for COVID-19 are in a state of rapid change based on information released by regulatory bodies including the  CDC and federal and [employer]. These policies and algorithms were followed during the patient's care in the ED.  Complex upper lip laceration due to dog bite. Tetanus UTD. LET applied. Toradol for pain. Discussed options for repair including ENT at [facility] vs [facility] at other facilities. Patient chose to see ENT at [facility]. Spoke with Dr. Elijah Birk (ENT) who will see patient once at [place] [facility].  After repair, she will Rx for Augmentin. Information faxed to Animal control.     Final Clinical Impression(s) / ED Diagnoses Final diagnoses:  Dog bite of face, initial encounter  Complicated laceration of lip, initial encounter   The patient appears reasonably screened and/or stabilized for discharge and I doubt any other medical condition or other [facility] requiring further screening, evaluation, or treatment in the ED at this time prior to discharge. Safe for discharge with strict return precautions.  Disposition: Transfer by POV  Condition: Good   ED Discharge Orders    None      Follow Up: [facility] [address]  [place], [place] [address] [phone] Go to        This chart was dictated using voice recognition software.  Despite best efforts to proofread,  errors can occur which can change the documentation meaning.   Nira Conn, MD 02/06/20 [phone]

## 2020-02-06 NOTE — Transfer of Care (Signed)
Immediate Anesthesia Transfer of Care Note  Patient: [place] [facility]  Procedure(s) Performed: FACIAL LACERATION REPAIR (N/A Face)  Patient Location: PACU  Anesthesia Type:General  Level of Consciousness: awake and alert   Airway & Oxygen Therapy: Patient Spontanous Breathing and Patient connected to face mask  Post-op Assessment: Report given to RN and Post -op Vital signs reviewed and stable  Post vital signs: Reviewed and stable  Last Vitals:  Vitals Value Taken Time  BP    Temp 36.2 C 02/06/20 0515  Pulse 96 02/06/20 0519  Resp 21 02/06/20 0519  SpO2 99 % 02/06/20 0519  Vitals shown include unvalidated device data.  Last Pain:  Vitals:   02/06/20 0215  TempSrc:   PainSc: 6          Complications: No complications documented.

## 2020-02-07 ENCOUNTER — Encounter (HOSPITAL_COMMUNITY): Payer: Self-pay | Admitting: Otolaryngology

## 2020-02-13 ENCOUNTER — Emergency Department (HOSPITAL_COMMUNITY)
Admission: EM | Admit: 2020-02-13 | Discharge: 2020-02-14 | Disposition: A | Discharge status: Home / Self Care | Payer: Self-pay | Attending: Emergency Medicine | Admitting: Emergency Medicine

## 2020-02-13 ENCOUNTER — Encounter (HOSPITAL_COMMUNITY): Payer: Self-pay | Admitting: Emergency Medicine

## 2020-02-13 DIAGNOSIS — Z5321 Procedure and treatment not carried out due to patient leaving prior to being seen by health care provider: Secondary | ICD-10-CM | POA: Insufficient documentation

## 2020-02-13 DIAGNOSIS — Z4802 Encounter for removal of sutures: Secondary | ICD-10-CM | POA: Insufficient documentation

## 2020-02-13 NOTE — ED Notes (Signed)
Pt not in waiting room, called x3

## 2020-02-13 NOTE — ED Triage Notes (Signed)
Pt presents for suture removal, multiple sutured noted to upper lip, pt reports they were placed 02/05/20 after dog bite. Red areas noted.

## 2020-02-14 ENCOUNTER — Other Ambulatory Visit: Payer: Self-pay

## 2020-02-14 ENCOUNTER — Emergency Department (HOSPITAL_BASED_OUTPATIENT_CLINIC_OR_DEPARTMENT_OTHER)
Admission: EM | Admit: 2020-02-14 | Discharge: 2020-02-14 | Disposition: A | Discharge status: Home / Self Care | Payer: Self-pay | Attending: Emergency Medicine | Admitting: Emergency Medicine

## 2020-02-14 ENCOUNTER — Encounter (HOSPITAL_BASED_OUTPATIENT_CLINIC_OR_DEPARTMENT_OTHER): Payer: Self-pay | Admitting: *Deleted

## 2020-02-14 DIAGNOSIS — Z4802 Encounter for removal of sutures: Secondary | ICD-10-CM | POA: Insufficient documentation

## 2020-02-14 DIAGNOSIS — S01511D Laceration without foreign body of lip, subsequent encounter: Secondary | ICD-10-CM | POA: Insufficient documentation

## 2020-02-14 DIAGNOSIS — W57XXXD Bitten or stung by nonvenomous insect and other nonvenomous arthropods, subsequent encounter: Secondary | ICD-10-CM | POA: Insufficient documentation

## 2020-02-14 NOTE — ED Triage Notes (Signed)
Suture removal upper lip.

## 2020-02-14 NOTE — ED Provider Notes (Signed)
[facility] EMERGENCY DEPARTMENT Provider Note   CSN: [id number] Arrival date & time: 02/14/20  1914     History Chief Complaint  Patient presents with  . Suture / Staple Removal    [place] [facility] is a 29 y.o. female presenting for suture removal.  Patient states a week ago she had sutures put in her lip after a dog bite.  She reports no increasing redness, pain, swelling, or drainage.  No fevers or chills.  She does have tenderness at the area.  Additional history obtained from chart review.  Sutures were placed by Dr. Elijah Birk with ENT.  HPI     Past Medical History:  Diagnosis Date  . Acid reflux     Patient Active Problem List   Diagnosis Date Noted  . Status post surgery 02/06/2020    Past Surgical History:  Procedure Laterality Date  . FACIAL LACERATION REPAIR N/A 02/06/2020   Procedure: FACIAL LACERATION REPAIR;  Surgeon: Rejeana Brock, MD;  Location: [facility] OR;  Service: ENT;  Laterality: N/A;  . NO PAST SURGERIES       OB History    Gravida  0   Para      Term      Preterm      AB      Living        SAB      TAB      Ectopic      Multiple      Live Births              No family history on file.  Social History   Tobacco Use  . Smoking status: Never Smoker  . Smokeless tobacco: Never Used  Substance Use Topics  . Alcohol use: Yes    Comment: occ  . Drug use: No    Home Medications Prior to Admission medications   Not on File    Allergies    Patient has no known allergies.  Review of Systems   Review of Systems  Constitutional: Negative for fever.  Skin: Positive for wound.    Physical Exam Updated Vital Signs BP (!) 142/100   Pulse 88   Temp 98.2 F (36.8 C) (Oral)   Resp 18   Ht 5\' 4"  (1.626 m)   Wt 53.5 kg   SpO2 100%   BMI 20.25 kg/m   Physical Exam Vitals and nursing note reviewed.  Constitutional:      General: She is not in acute distress.    Appearance: She is  well-developed.  HENT:     Head: Normocephalic and atraumatic.     Comments: Healing laceration of the upper lip.  No erythema, warmth, or drainage.  Some sutures are partly buried/overgrown Pulmonary:     Effort: Pulmonary effort is normal.  Abdominal:     General: There is no distension.  Musculoskeletal:        General: Normal range of motion.     Cervical back: Normal range of motion.  Skin:    General: Skin is warm.     Findings: No rash.  Neurological:     Mental Status: She is alert and oriented to person, place, and time.     ED Results / Procedures / Treatments   Labs (all labs ordered are listed, but only abnormal results are displayed) Labs Reviewed - No data to display  EKG None  Radiology No results found.  Procedures .Suture Removal  Date/Time: 02/14/2020 9:05 PM Performed  by: Alveria Apley, PA-C Authorized by: Alveria Apley, PA-C   Consent:    Consent obtained:  Verbal   Consent given by:  Patient   Risks discussed:  Bleeding, pain and wound separation Location:    Location:  Mouth   Mouth location:  Upper exterior lip Procedure details:    Wound appearance:  Tender, nonpurulent, no signs of infection and good wound healing Post-procedure details:    Post-removal:  Steri-Strips applied   Patient tolerance of procedure:  Tolerated well, no immediate complications   (including critical care time)  Medications Ordered in ED Medications - No data to display  ED Course  I have reviewed the triage vital signs and the nursing notes.  Pertinent labs & imaging results that were available during my care of the patient were reviewed by me and considered in my medical decision making (see chart for details).    MDM Rules/Calculators/A&P                          Patient presenting for suture removal.  On exam, wound does not appear infected.  Sutures removed as described above.  Patient's laceration/lip is tender, and some of the sutures were  partially buried/overgrown.  After suture removal, there is one area where the skin has not completely joined, Steri-Strips applied to this location.  Discussed importance of follow-up with Dr. Elijah Birk from ENT as needed for further evaluation.  At this time, patient appears safe for discharge.  Return precautions given.  Patient states she understands and agrees to plan.  Final Clinical Impression(s) / ED Diagnoses Final diagnoses:  Visit for suture removal    Rx / DC Orders ED Discharge Orders    None       Alveria Apley, PA-C 02/14/20 2106    Virgina Norfolk, DO 02/14/20 2151

## 2020-02-14 NOTE — Discharge Instructions (Addendum)
Follow up with Dr. Elijah Birk as needed for further evaluation.  Return to the ER with any new, worsening, or concerning symptoms.

## 2020-02-14 NOTE — ED Notes (Signed)
Suture site does not appear to be swollen or red at this time. Sutures intact upon examination with negligible drainage.

## 2020-09-12 ENCOUNTER — Other Ambulatory Visit: Payer: Self-pay

## 2020-09-12 ENCOUNTER — Encounter (HOSPITAL_BASED_OUTPATIENT_CLINIC_OR_DEPARTMENT_OTHER): Payer: Self-pay | Admitting: *Deleted

## 2020-09-12 ENCOUNTER — Emergency Department (HOSPITAL_BASED_OUTPATIENT_CLINIC_OR_DEPARTMENT_OTHER)
Admission: EM | Admit: 2020-09-12 | Discharge: 2020-09-12 | Disposition: A | Discharge status: Home / Self Care | Payer: BC Managed Care – PPO | Attending: Emergency Medicine | Admitting: Emergency Medicine

## 2020-09-12 DIAGNOSIS — H5712 Ocular pain, left eye: Secondary | ICD-10-CM | POA: Diagnosis not present

## 2020-09-12 DIAGNOSIS — H53149 Visual discomfort, unspecified: Secondary | ICD-10-CM | POA: Diagnosis not present

## 2020-09-12 MED ORDER — ERYTHROMYCIN 5 MG/GM OP OINT
TOPICAL_OINTMENT | Freq: Once | OPHTHALMIC | Status: AC
  Filled 2020-09-12: qty 3.5

## 2020-09-12 MED ORDER — TETRACAINE HCL 0.5 % OP SOLN
2.0000 [drp] | Freq: Once | OPHTHALMIC | Status: AC
  Administered 2020-09-12: 14:00:00 2 [drp] via OPHTHALMIC
  Filled 2020-09-12: qty 4

## 2020-09-12 MED ORDER — ERYTHROMYCIN 5 MG/GM OP OINT: TOPICAL_OINTMENT | OPHTHALMIC | 0 refills | Status: AC

## 2020-09-12 MED ORDER — FLUORESCEIN SODIUM 1 MG OP STRP
1.0000 | ORAL_STRIP | Freq: Once | OPHTHALMIC | Status: AC
  Administered 2020-09-12: 14:00:00 1 via OPHTHALMIC
  Filled 2020-09-12: qty 1

## 2020-09-12 NOTE — ED Provider Notes (Signed)
[facility] EMERGENCY DEPARTMENT Provider Note   CSN: [id number] Arrival date & time: 09/12/20  1303     History Chief Complaint  Patient presents with   Eye Problem    [facility] is a 30 y.o. female.  The history is provided by the patient.  Eye Problem Location:  Left eye Quality:  Aching Severity:  Mild Onset quality:  Gradual Duration:  3 days Timing:  Constant Progression:  Unchanged Chronicity:  New Context: foreign body (?)   Relieved by:  Nothing Worsened by:  Nothing Associated symptoms: itching, photophobia and tearing   Associated symptoms: no blurred vision, no crusting, no decreased vision, no discharge, no double vision, no facial rash, no headaches, no inflammation, no nausea, no numbness, no redness, no scotomas, no swelling, no tingling, no vomiting and no weakness       Past Medical History:  Diagnosis Date   Acid reflux     Patient Active Problem List   Diagnosis Date Noted   Status post surgery 02/06/2020    Past Surgical History:  Procedure Laterality Date   FACIAL LACERATION REPAIR N/A 02/06/2020   Procedure: FACIAL LACERATION REPAIR;  Surgeon: Rejeana Brock, MD;  Location: [facility] OR;  Service: ENT;  Laterality: N/A;   NO PAST SURGERIES       OB History     Gravida  0   Para      Term      Preterm      AB      Living         SAB      IAB      Ectopic      Multiple      Live Births              No family history on file.  Social History   Tobacco Use   Smoking status: Never   Smokeless tobacco: Never  Vaping Use   Vaping Use: Never used  Substance Use Topics   Alcohol use: Yes    Comment: occ   Drug use: No    Home Medications Prior to Admission medications   Not on File    Allergies    Patient has no known allergies.  Review of Systems   Review of Systems  Eyes:  Positive for photophobia and itching. Negative for blurred vision, double vision, discharge and redness.   Gastrointestinal:  Negative for nausea and vomiting.  Neurological:  Negative for tingling, weakness, numbness and headaches.   Physical Exam Updated Vital Signs BP (!) 137/106 (BP Location: Left Arm)   Pulse 76   Temp 98.4 F (36.9 C) (Oral)   Resp 18   Ht 5\' 4"  (1.626 m)   Wt 48.7 kg   LMP 08/30/2020   SpO2 99%   BMI 18.44 kg/m   Physical Exam Constitutional:      General: She is not in acute distress.    Appearance: She is not ill-appearing.  HENT:     Head: Normocephalic and atraumatic.     Nose: Nose normal.     Mouth/Throat:     Mouth: Mucous membranes are dry.  Eyes:     Extraocular Movements: Extraocular movements intact.     Pupils: Pupils are equal, round, and reactive to light.     Comments: Redness to the conjunctiva to the left eye, normal visual acuity, no visual field deficit, normal extraocular movements of the eye, pupils are equal and reactive bilaterally, my  Tono-Pen is broken unable to check for eye pressure but does not clinically appear to be a glaucoma.  Fluorescein examination did not show any obvious corneal abrasion, no obvious foreign body  Cardiovascular:     Pulses: Normal pulses.     Heart sounds: Normal heart sounds.  Musculoskeletal:     Cervical back: Normal range of motion.  Neurological:     Mental Status: She is alert.    ED Results / Procedures / Treatments   Labs (all labs ordered are listed, but only abnormal results are displayed) Labs Reviewed - No data to display  EKG None  Radiology No results found.  Procedures Procedures   Medications Ordered in ED Medications  fluorescein ophthalmic strip 1 strip (has no administration in time range)  tetracaine (PONTOCAINE) 0.5 % ophthalmic solution 2 drop (has no administration in time range)  erythromycin ophthalmic ointment (has no administration in time range)    ED Course  I have reviewed the triage vital signs and the nursing notes.  Pertinent labs & imaging results  that were available during my care of the patient were reviewed by me and considered in my medical decision making (see chart for details).    MDM Rules/Calculators/A&P                          TANIKKA BRESNAN is here with left thigh pain.  Feels like a foreign body sensation.  Thinks that she might of gotten something in her eye at work several days ago.  No obvious corneal abrasion on exam.  No obvious foreign body on exam.  Tono-Pen was broken but clinically does not appear to be glaucoma.  She is not having any severe pain behind the eye, normal extraocular movements, pupils equal reactive and round.  Suspect the small corneal abrasion or iritis.  We will have her follow-up with ophthalmology.  Started on erythromycin ointment.  Visual acuity is normal.  Discharged in good condition.  This chart was dictated using voice recognition software.  Despite best efforts to proofread,  errors can occur which can change the documentation meaning.   Final Clinical Impression(s) / ED Diagnoses Final diagnoses:  Left eye pain    Rx / DC Orders ED Discharge Orders     None        Virgina Norfolk, DO 09/12/20 1348

## 2020-09-12 NOTE — Discharge Instructions (Addendum)
Call did not quite see a foreign object or corneal abrasion but start antibiotics in case there is corneal abrasion.  Follow-up with eye doctor for more detailed exam to rule out other causes for left eye pain.

## 2020-09-12 NOTE — ED Notes (Signed)
Pt read 20/20 using L eye, 20/15 using both

## 2020-09-12 NOTE — ED Triage Notes (Signed)
When she left work yesterday her left eye was sensitive to light. She woke this am with swelling to her left eyelid, redness to her eye, and tearing.

## 2021-06-05 ENCOUNTER — Other Ambulatory Visit: Payer: Self-pay

## 2021-06-05 ENCOUNTER — Emergency Department (HOSPITAL_COMMUNITY)
Admission: EM | Admit: 2021-06-05 | Discharge: 2021-06-05 | Disposition: A | Discharge status: Home / Self Care | Payer: BC Managed Care – PPO | Attending: Emergency Medicine | Admitting: Emergency Medicine

## 2021-06-05 ENCOUNTER — Encounter (HOSPITAL_COMMUNITY): Payer: Self-pay | Admitting: Emergency Medicine

## 2021-06-05 DIAGNOSIS — F419 Anxiety disorder, unspecified: Secondary | ICD-10-CM

## 2021-06-05 DIAGNOSIS — F12120 Cannabis abuse with intoxication, uncomplicated: Secondary | ICD-10-CM | POA: Insufficient documentation

## 2021-06-05 DIAGNOSIS — R002 Palpitations: Secondary | ICD-10-CM | POA: Insufficient documentation

## 2021-06-05 DIAGNOSIS — R0602 Shortness of breath: Secondary | ICD-10-CM | POA: Insufficient documentation

## 2021-06-05 DIAGNOSIS — F12920 Cannabis use, unspecified with intoxication, uncomplicated: Secondary | ICD-10-CM

## 2021-06-05 MED ORDER — HYDROXYZINE HCL 25 MG PO TABS: 25.0000 mg | ORAL_TABLET | Freq: Three times a day (TID) | ORAL | 0 refills | Status: AC | PRN

## 2021-06-05 NOTE — ED Triage Notes (Signed)
BIB EMS from home, reports taking an edible last night around 10am, woke up with ' anxiety symptoms' per EMS. Tachypnic  initially w/ EMS.  ? ?Vitals w/ EMS were  ?BP 118/82 ?P 80 ?O2 98% RA ?CBG 110  ?

## 2021-06-05 NOTE — ED Provider Notes (Signed)
?[facility]-EMERGENCY DEPT ?Provider Note ? ? ?CSN: [id number] ?Arrival date & time: 06/05/21  1044 ? ?  ? ?History ? ?Chief Complaint  ?Patient presents with  ? Anxiety  ? ? ?[place] [facility] is a 31 y.o. female presented to the ER with anxiety after consumption of edible marijuana yesterday.  She reports that she took a marijuana gummy yesterday evening.  She still feels intoxicated this morning.  She says she does not typically regularly use marijuana or THC.  She does suffer from anxiety, and had an anxiety attack last month, and is having the same symptoms this morning with shortness of breath and palpitations.  Is arriving in the ER she feels that her breathing is better and she feels better overall. ? ?HPI ? ?  ? ?Home Medications ?Prior to Admission medications   ?Medication Sig Start Date End Date Taking? Authorizing Provider  ?hydrOXYzine (ATARAX) 25 MG tablet Take 1 tablet (25 mg total) by mouth every 8 (eight) hours as needed for up to 15 doses. 06/05/21  Yes Terald Sleeper, MD  ?erythromycin ophthalmic ointment Place a 1/2 inch ribbon of ointment into the lower eyelid every 6 hours for 5 days 09/12/20   Virgina Norfolk, DO  ?   ? ?Allergies    ?Patient has no known allergies.   ? ?Review of Systems   ?Review of Systems ? ?Physical Exam ?Updated Vital Signs ?BP (!) 143/92 (BP Location: Right Arm)   Pulse 85   Temp 98.7 ?F (37.1 ?C) (Oral)   Resp 16   Ht 5\' 4"  (1.626 m)   Wt 49 kg   SpO2 97%   BMI 18.54 kg/m?  ?Physical Exam ?Constitutional:   ?   General: She is not in acute distress. ?HENT:  ?   Head: Normocephalic and atraumatic.  ?Eyes:  ?   Conjunctiva/sclera: Conjunctivae normal.  ?   Pupils: Pupils are equal, round, and reactive to light.  ?Cardiovascular:  ?   Rate and Rhythm: Normal rate and regular rhythm.  ?Pulmonary:  ?   Effort: Pulmonary effort is normal. No respiratory distress.  ?Skin: ?   General: Skin is warm and dry.  ?Neurological:  ?   General: No focal  deficit present.  ?   Mental Status: She is alert and oriented to person, place, and time. Mental status is at baseline.  ? ? ?ED Results / Procedures / Treatments   ?Labs ?(all labs ordered are listed, but only abnormal results are displayed) ?Labs Reviewed - No data to display ? ?EKG ?None ? ?Radiology ?No results found. ? ?Procedures ?Procedures  ? ? ?Medications Ordered in ED ?Medications - No data to display ? ?ED Course/ Medical Decision Making/ A&P ?  ?                        ?Medical Decision Making ?Risk ?Prescription drug management. ? ? ?Patient is here with suspected anxiety attack in the setting of edible marijuana use last night.  I explained that THC, particularly in edibles, and can linger in the system for a long period of time, even up to 24 hours with acute intoxication, and I believe this may be a trigger for her anxiety.  I do not see any evidence of medical emergency such as pneumothorax, ACS, stroke, or other life-threatening infection or illness.  I can prescribe some Atarax as needed at home moving forward as she has had prior anxiety attacks.  Advised  that she schedule an appointment with a counselor will provide an office number for them.  I advised that she rest and sleep in a safe and comfortable environment today to allow the rest of the THC to wear off.  She verbalized understanding and agreement with the plan. ? ? ? ? ? ? ? ?Final Clinical Impression(s) / ED Diagnoses ?Final diagnoses:  ?Anxiety  ?Cannabis intoxication without complication (HCC)  ? ? ?Rx / DC Orders ?ED Discharge Orders   ? ?      Ordered  ?  hydrOXYzine (ATARAX) 25 MG tablet  Every 8 hours PRN       ? 06/05/21 1136  ? ?  ?  ? ?  ? ? ?  ?Terald Sleeper, MD ?06/05/21 1139 ? ?
